# Patient Record
Sex: Female | Born: 1945
Health system: Southern US, Community
[De-identification: ages and names within clinical notes are randomized; demographics above are authoritative.]

## PROBLEM LIST (undated history)

## (undated) DIAGNOSIS — I2510 Atherosclerotic heart disease of native coronary artery without angina pectoris: Secondary | ICD-10-CM

## (undated) DIAGNOSIS — K219 Gastro-esophageal reflux disease without esophagitis: Secondary | ICD-10-CM

## (undated) DIAGNOSIS — I10 Essential (primary) hypertension: Secondary | ICD-10-CM

## (undated) DIAGNOSIS — I5181 Takotsubo syndrome: Principal | ICD-10-CM

## (undated) DIAGNOSIS — N8110 Cystocele, unspecified: Secondary | ICD-10-CM

## (undated) HISTORY — PX: TONSILLECTOMY: SUR1361

## (undated) HISTORY — PX: TUBAL LIGATION: SHX77

## (undated) HISTORY — DX: Atherosclerotic heart disease of native coronary artery without angina pectoris: I25.10

## (undated) HISTORY — DX: Essential (primary) hypertension: I10

---

## 1977-06-21 HISTORY — PX: BREAST CYST EXCISION: SHX579

## 2009-06-21 HISTORY — PX: BREAST SURGERY: SHX581

## 2012-03-15 DIAGNOSIS — Z23 Encounter for immunization: Secondary | ICD-10-CM | POA: Diagnosis not present

## 2012-05-04 DIAGNOSIS — K219 Gastro-esophageal reflux disease without esophagitis: Secondary | ICD-10-CM | POA: Diagnosis not present

## 2012-05-04 DIAGNOSIS — Z01818 Encounter for other preprocedural examination: Secondary | ICD-10-CM | POA: Diagnosis not present

## 2012-05-25 DIAGNOSIS — Q391 Atresia of esophagus with tracheo-esophageal fistula: Secondary | ICD-10-CM | POA: Diagnosis not present

## 2012-05-25 DIAGNOSIS — K219 Gastro-esophageal reflux disease without esophagitis: Secondary | ICD-10-CM | POA: Diagnosis not present

## 2012-05-25 DIAGNOSIS — K2940 Chronic atrophic gastritis without bleeding: Secondary | ICD-10-CM | POA: Diagnosis not present

## 2012-05-25 DIAGNOSIS — K2990 Gastroduodenitis, unspecified, without bleeding: Secondary | ICD-10-CM | POA: Diagnosis not present

## 2012-05-25 DIAGNOSIS — K2970 Gastritis, unspecified, without bleeding: Secondary | ICD-10-CM | POA: Diagnosis not present

## 2012-05-25 DIAGNOSIS — K257 Chronic gastric ulcer without hemorrhage or perforation: Secondary | ICD-10-CM | POA: Diagnosis not present

## 2012-05-25 DIAGNOSIS — K228 Other specified diseases of esophagus: Secondary | ICD-10-CM | POA: Diagnosis not present

## 2012-06-01 DIAGNOSIS — A048 Other specified bacterial intestinal infections: Secondary | ICD-10-CM | POA: Diagnosis not present

## 2012-06-26 DIAGNOSIS — Z124 Encounter for screening for malignant neoplasm of cervix: Secondary | ICD-10-CM | POA: Diagnosis not present

## 2012-06-26 DIAGNOSIS — Z1231 Encounter for screening mammogram for malignant neoplasm of breast: Secondary | ICD-10-CM | POA: Diagnosis not present

## 2012-06-26 DIAGNOSIS — N8111 Cystocele, midline: Secondary | ICD-10-CM | POA: Diagnosis not present

## 2012-06-26 DIAGNOSIS — R635 Abnormal weight gain: Secondary | ICD-10-CM | POA: Diagnosis not present

## 2012-06-26 DIAGNOSIS — M25519 Pain in unspecified shoulder: Secondary | ICD-10-CM | POA: Diagnosis not present

## 2012-06-26 DIAGNOSIS — N951 Menopausal and female climacteric states: Secondary | ICD-10-CM | POA: Diagnosis not present

## 2012-07-04 DIAGNOSIS — Z1211 Encounter for screening for malignant neoplasm of colon: Secondary | ICD-10-CM | POA: Diagnosis not present

## 2012-07-04 DIAGNOSIS — F411 Generalized anxiety disorder: Secondary | ICD-10-CM | POA: Diagnosis not present

## 2012-10-18 DIAGNOSIS — H1045 Other chronic allergic conjunctivitis: Secondary | ICD-10-CM | POA: Diagnosis not present

## 2012-10-18 DIAGNOSIS — H52229 Regular astigmatism, unspecified eye: Secondary | ICD-10-CM | POA: Diagnosis not present

## 2012-10-18 DIAGNOSIS — H5210 Myopia, unspecified eye: Secondary | ICD-10-CM | POA: Diagnosis not present

## 2012-10-18 DIAGNOSIS — H2510 Age-related nuclear cataract, unspecified eye: Secondary | ICD-10-CM | POA: Diagnosis not present

## 2012-12-09 DIAGNOSIS — R300 Dysuria: Secondary | ICD-10-CM | POA: Diagnosis not present

## 2012-12-09 DIAGNOSIS — N390 Urinary tract infection, site not specified: Secondary | ICD-10-CM | POA: Diagnosis not present

## 2013-01-24 DIAGNOSIS — N390 Urinary tract infection, site not specified: Secondary | ICD-10-CM | POA: Diagnosis not present

## 2013-04-18 DIAGNOSIS — Z719 Counseling, unspecified: Secondary | ICD-10-CM | POA: Diagnosis not present

## 2013-04-18 DIAGNOSIS — R9431 Abnormal electrocardiogram [ECG] [EKG]: Secondary | ICD-10-CM | POA: Diagnosis not present

## 2013-04-18 DIAGNOSIS — R51 Headache: Secondary | ICD-10-CM | POA: Diagnosis not present

## 2013-04-18 DIAGNOSIS — R0602 Shortness of breath: Secondary | ICD-10-CM | POA: Diagnosis not present

## 2013-04-18 DIAGNOSIS — R42 Dizziness and giddiness: Secondary | ICD-10-CM | POA: Diagnosis not present

## 2013-04-24 ENCOUNTER — Emergency Department (HOSPITAL_COMMUNITY)
Admission: EM | Admit: 2013-04-24 | Discharge: 2013-04-24 | Disposition: A | Discharge status: Home / Self Care | Payer: Medicare Other | Attending: Emergency Medicine | Admitting: Emergency Medicine

## 2013-04-24 ENCOUNTER — Emergency Department (HOSPITAL_COMMUNITY): Payer: Medicare Other

## 2013-04-24 ENCOUNTER — Encounter (HOSPITAL_COMMUNITY): Payer: Self-pay | Admitting: Emergency Medicine

## 2013-04-24 DIAGNOSIS — Z79899 Other long term (current) drug therapy: Secondary | ICD-10-CM | POA: Diagnosis not present

## 2013-04-24 DIAGNOSIS — R51 Headache: Secondary | ICD-10-CM | POA: Diagnosis not present

## 2013-04-24 DIAGNOSIS — I10 Essential (primary) hypertension: Secondary | ICD-10-CM | POA: Diagnosis not present

## 2013-04-24 DIAGNOSIS — R0609 Other forms of dyspnea: Secondary | ICD-10-CM | POA: Insufficient documentation

## 2013-04-24 DIAGNOSIS — K219 Gastro-esophageal reflux disease without esophagitis: Secondary | ICD-10-CM | POA: Insufficient documentation

## 2013-04-24 DIAGNOSIS — R42 Dizziness and giddiness: Secondary | ICD-10-CM | POA: Diagnosis not present

## 2013-04-24 DIAGNOSIS — R0989 Other specified symptoms and signs involving the circulatory and respiratory systems: Secondary | ICD-10-CM | POA: Insufficient documentation

## 2013-04-24 DIAGNOSIS — R110 Nausea: Secondary | ICD-10-CM | POA: Diagnosis not present

## 2013-04-24 DIAGNOSIS — R0600 Dyspnea, unspecified: Secondary | ICD-10-CM

## 2013-04-24 DIAGNOSIS — R079 Chest pain, unspecified: Secondary | ICD-10-CM | POA: Insufficient documentation

## 2013-04-24 HISTORY — DX: Gastro-esophageal reflux disease without esophagitis: K21.9

## 2013-04-24 LAB — CBC WITH DIFFERENTIAL/PLATELET
Eosinophils Absolute: 0.1 10*3/uL (ref 0.0–0.7)
Eosinophils Relative: 2 % (ref 0–5)
Lymphocytes Relative: 37 % (ref 12–46)
Lymphs Abs: 2.2 10*3/uL (ref 0.7–4.0)
MCH: 30.8 pg (ref 26.0–34.0)
MCV: 89.1 fL (ref 78.0–100.0)
Monocytes Relative: 9 % (ref 3–12)
Neutro Abs: 3.1 10*3/uL (ref 1.7–7.7)
Platelets: 273 10*3/uL (ref 150–400)
RBC: 4.42 MIL/uL (ref 3.87–5.11)

## 2013-04-24 LAB — COMPREHENSIVE METABOLIC PANEL
BUN: 18 mg/dL (ref 6–23)
Calcium: 10.1 mg/dL (ref 8.4–10.5)
Chloride: 106 mEq/L (ref 96–112)
Creatinine, Ser: 0.77 mg/dL (ref 0.50–1.10)
GFR calc Af Amer: >90 mL/min (ref >90)
Glucose, Bld: 98 mg/dL (ref 70–99)
Sodium: 142 mEq/L (ref 135–145)
Total Protein: 7.5 g/dL (ref 6.0–8.3)

## 2013-04-24 LAB — URINALYSIS, ROUTINE W REFLEX MICROSCOPIC
Hgb urine dipstick: NEGATIVE
Leukocytes, UA: NEGATIVE
Nitrite: NEGATIVE
Protein, ur: NEGATIVE mg/dL
Urobilinogen, UA: 0.2 mg/dL (ref 0.0–1.0)

## 2013-04-24 LAB — D-DIMER, QUANTITATIVE: D-Dimer, Quant: 0.33 ug/mL-FEU (ref 0.00–0.48)

## 2013-04-24 LAB — TROPONIN I
Troponin I: <0.3 ng/mL (ref <0.30)
Troponin I: <0.3 ng/mL (ref <0.30)

## 2013-04-24 MED ORDER — ASPIRIN 81 MG PO CHEW
CHEWABLE_TABLET | ORAL | Status: AC
  Administered 2013-04-24: 324 mg via ORAL
  Filled 2013-04-24: qty 4

## 2013-04-24 MED ORDER — ASPIRIN 81 MG PO CHEW
324.0000 mg | CHEWABLE_TABLET | Freq: Once | ORAL | Status: AC
  Administered 2013-04-24: 324 mg via ORAL

## 2013-04-24 NOTE — ED Notes (Signed)
Dr. Manus Gunning at bedside speaking with pt and family

## 2013-04-24 NOTE — ED Notes (Signed)
Ambulated around nurses station without difficulty. Denies being SOB

## 2013-04-24 NOTE — ED Notes (Signed)
Pt c/o intermittent sob, dizziness, left arm and left chest pain for the past few weeks, was seen at urgent care, sent to cardiologist who advised pt that everything was fine, was told that if she continued to have problems then they would do further workup, pt woke up this am with left arm and chest pain, states "i just felt worse and could not wait", Dr. Manus Gunning in prior to RN, see edp assessment for futher,

## 2013-04-24 NOTE — Consult Note (Signed)
Primary Physician: Primary Cardiologist:   HPI:  Pateint is a a67 yo with no prior cardiac problems  Asked to see re CP and SOB  The patient say she noticed over the past couple wks onset of CP  She describes 2 types  One is sharp, short live  Can come to different parts of chest  The other is a pressure sensation that occurs across chest  Both come and go  Not always associated with activity  Can come at rest. The patient also notes dyspnea on exertion that is new  Now when she walks across the house she gets SOB  NO SOB at rest  No PND  She [facility] [place].  Referred to a cardiology  Seen last wk.  Told nothing rwrong with heart.    Also complains of dizziness  No syncope and HA on top of head  Trying to drink more fluids        Past Medical History  Diagnosis Date  . GERD (gastroesophageal reflux disease)      (Not in a hospital admission)     Infusions:   Allergies  Allergen Reactions  . Ciprofloxacin     myalgia    History   Social History  . Marital Status: Widowed    Spouse Name: N/A    Number of Children: N/A  . Years of Education: N/A   Occupational History  . Not on file.   Social History Main Topics  . Smoking status: Never Smoker   . Smokeless tobacco: Not on file  . Alcohol Use: No  . Drug Use: Not on file  . Sexual Activity: Not on file   Other Topics Concern  . Not on file   Social History Narrative  . No narrative on file    No family history on file.  REVIEW OF SYSTEMS:  All systems reviewed  Negative to the above problem except as noted above.    PHYSICAL EXAM: Filed Vitals:   04/24/13 1018  BP: 164/70  Pulse: 61  Temp:   Resp: 16    No intake or output data in the 24 hours ending 04/24/13 1123  General:  Thin 67 yo in NAD HEENT: normal Neck: supple. no JVD. Carotids 2+ bilat; no bruits. No lymphadenopathy or thryomegaly appreciated. Cor: PMI nondisplaced. Regular rate & rhythm. No rubs,  gallops or murmurs. Lungs: clear Abdomen: soft, nontender, nondistended. No hepatosplenomegaly. No bruits or masses. Good bowel sounds. Extremities: no cyanosis, clubbing, rash, edema Neuro: alert & oriented x 3, cranial nerves grossly intact. moves all 4 extremities w/o difficulty. Affect pleasant.  ECG:  SR 68  Anteroseptal MI    Results for orders placed during the hospital encounter of 04/24/13 (from the past 24 hour(s))  CBC WITH DIFFERENTIAL     Status: None   Collection Time    04/24/13  9:10 AM      Result Value Range   WBC 5.9  4.0 - 10.5 K/uL   RBC 4.42  3.87 - 5.11 MIL/uL   Hemoglobin 13.6  12.0 - 15.0 g/dL   HCT 45.4  09.8 - 11.9 %   MCV 89.1  78.0 - 100.0 fL   MCH 30.8  26.0 - 34.0 pg   MCHC 34.5  30.0 - 36.0 g/dL   RDW 14.7  82.9 - 56.2 %   Platelets 273  150 - 400 K/uL   Neutrophils Relative % 52  43 - 77 %  Neutro Abs 3.1  1.7 - 7.7 K/uL   Lymphocytes Relative 37  12 - 46 %   Lymphs Abs 2.2  0.7 - 4.0 K/uL   Monocytes Relative 9  3 - 12 %   Monocytes Absolute 0.5  0.1 - 1.0 K/uL   Eosinophils Relative 2  0 - 5 %   Eosinophils Absolute 0.1  0.0 - 0.7 K/uL   Basophils Relative 0  0 - 1 %   Basophils Absolute 0.0  0.0 - 0.1 K/uL  COMPREHENSIVE METABOLIC PANEL     Status: Abnormal   Collection Time    04/24/13  9:10 AM      Result Value Range   Sodium 142  135 - 145 mEq/L   Potassium 3.6  3.5 - 5.1 mEq/L   Chloride 106  96 - 112 mEq/L   CO2 25  19 - 32 mEq/L   Glucose, Bld 98  70 - 99 mg/dL   BUN 18  6 - 23 mg/dL   Creatinine, Ser 1.61  0.50 - 1.10 mg/dL   Calcium 09.6  8.4 - 04.5 mg/dL   Total Protein 7.5  6.0 - 8.3 g/dL   Albumin 4.3  3.5 - 5.2 g/dL   AST 21  0 - 37 U/L   ALT 19  0 - 35 U/L   Alkaline Phosphatase 152 (*) 39 - 117 U/L   Total Bilirubin 0.4  0.3 - 1.2 mg/dL   GFR calc non Af Amer 86 (*) >90 mL/min   GFR calc Af Amer >90  >90 mL/min  TROPONIN I     Status: None   Collection Time    04/24/13  9:10 AM      Result Value Range    Troponin I <0.30  <0.30 ng/mL  D-DIMER, QUANTITATIVE     Status: None   Collection Time    04/24/13  9:10 AM      Result Value Range   D-Dimer, Quant 0.33  0.00 - 0.48 ug/mL-FEU   Dg Chest 2 View  04/24/2013   CLINICAL DATA:  Shortness of breath for 2 weeks  EXAM: CHEST  2 VIEW  COMPARISON:  None.  FINDINGS: Cardiomediastinal silhouette is unremarkable. No acute infiltrate or pleural effusion. No pulmonary edema. Minimal lower thoracic levoscoliosis. Mild degenerative changes mid thoracic spine.  IMPRESSION: No active cardiopulmonary disease. Minimal lower thoracic levoscoliosis.   Electronically Signed   By: Natasha Mead M.D.   On: 04/24/2013 09:53   Ct Head Wo Contrast  04/24/2013   CLINICAL DATA:  Headache and lightheadedness for 2 weeks, no known injury  EXAM: CT HEAD WITHOUT CONTRAST  TECHNIQUE: Contiguous axial images were obtained from the base of the skull through the vertex without intravenous contrast.  COMPARISON:  None.  FINDINGS: No mass lesion. No midline shift. No acute hemorrhage or hematoma. No extra-axial fluid collections. No evidence of acute infarction. Calvarium is intact. No significant inflammatory change in the sinuses.  IMPRESSION: Negative   Electronically Signed   By: Esperanza Heir M.D.   On: 04/24/2013 09:49     ASSESSMENT:  Patient is a 67 yo with no history of CAD Now with a few wk history of CP (pain and pressure ) and DOE.  Symptoms are not completely typical  Does have DOE which is new.  Exam unremarkable (though would get O2 sat while walking )  EKG with Q waves V1, V2 Small R V3  May be lead placement  Discussed with patinet  Needs evaluation  Discussed pro/con of stress test vs cath  She would prefer stress test.  Have sched Stress Cardiolite for Thursday 11/6  Needs to be in Radiology department at 7 AM NPO   Would recomm 81 mg ASA until test  Take activity at own pace.    HCM  Will need lipids  HTN  BP is high  Today  Will need to follow.

## 2013-04-24 NOTE — Progress Notes (Signed)
ED/CM noted patient did not have health insurance and/or PCP listed in the computer.  Patient was given the [place] resource handout with information on the clinics, food pantries, and the handout for new health insurance sign-up.  Patient expressed appreciation for this. 

## 2013-04-24 NOTE — ED Provider Notes (Signed)
CSN: [id number]     Arrival date & time 04/24/13  4696 History   This chart was scribed for Glynn Octave, MD, by Yevette Edwards, ED Scribe. This patient was seen in room [facility]18[facility] and the patient's care was started at 8:57 AM.  First MD Initiated Contact with Patient 04/24/13 [phone]     Chief Complaint  Patient presents with  . Shortness of Breath    HPI HPI Comments: Nancy Winters is a 67 y.o. female who presents to the Emergency Department complaining of two weeks of gradually-increasing, intermittent SOB which is increased with ambulation and is associated with the symptoms of intermittent chest pain, lightheadedness, nausea and headaches. She reports that at baseline she ambulates well. The SOB is not increased with a supine position. The pt visited [facility] [address] and was referred to a cardiologist, Dr. Adron Bene with [facility]. The pt has another appointment with a second cardiologist in six days, on April 30, 2013. She denies any known cardiac issues or previous performances of a stress test.  The pt has experienced intermittent chest pain which typically lasts for a few seconds or a few moments. The chest pain is intermittently underneath her left breast, underneath her right breast, or into her left shoulder.  Her last episode of chest pain was this morning. The headache is described as "nagging," is typically gradual-onset, and is located at the "top" of her head. She occasionally experiences a headache and lightheadedness even at rest.  She denies a h/o HTN. She denies taking any medications.  The pt is a non-smoker, though she lived with a smoker for 40 years.     The pt does not have a PCP. Past Medical History  Diagnosis Date  . GERD (gastroesophageal reflux disease)    Past Surgical History  Procedure Laterality Date  . Breast surgery    . Tonsillectomy    . Tubal ligation     No family history on file. History  Substance Use Topics  . Smoking status: Never  Smoker   . Smokeless tobacco: Not on file  . Alcohol Use: No   No OB history provided.  Review of Systems  Respiratory: Positive for shortness of breath.   Cardiovascular: Positive for chest pain.  Gastrointestinal: Positive for nausea.  Neurological: Positive for light-headedness and headaches.  All other systems reviewed and are negative.    Allergies  Ciprofloxacin  Home Medications   Current Outpatient Rx  Name  Route  Sig  Dispense  Refill  . CRANBERRY PO   Oral   Take 2 capsules by mouth daily.         . Glucosamine Sulfate-MSM (GLUCOSAMINE-MSM DS PO)   Oral   Take 1 tablet by mouth 3 (three) times daily.         . Multiple Vitamins-Minerals (MULTIVITAMINS THER. W/MINERALS) TABS tablet   Oral   Take 1 tablet by mouth daily.         Marland Kitchen omeprazole (PRILOSEC) 20 MG capsule   Oral   Take 20 mg by mouth daily.         . ranitidine (ZANTAC) 300 MG tablet   Oral   Take 300 mg by mouth at bedtime.           Triage Vitals: BP 176/76  Pulse 71  Temp(Src) 98 F (36.7 C) (Oral)  Resp 23  Ht 5\' 6"  (1.676 m)  Wt 153 lb (69.4 kg)  BMI 24.71 kg/m2  SpO2 100%  Physical Exam  Nursing note and vitals reviewed. Constitutional: She is oriented to person, place, and time. She appears well-developed and well-nourished. No distress.  HENT:  Head: Normocephalic and atraumatic.  Eyes: EOM are normal.  Neck: Neck supple. No tracheal deviation present.  Cardiovascular: Normal rate, regular rhythm and normal heart sounds.   No murmur heard. Pulmonary/Chest: Effort normal and breath sounds normal. No respiratory distress. She has no wheezes. She has no rales.  Abdominal: Soft. There is no tenderness.  Musculoskeletal: Normal range of motion.  Neurological: She is alert and oriented to person, place, and time. She has normal reflexes. She displays normal reflexes. No cranial nerve deficit. She exhibits normal muscle tone. Coordination normal.  CN 2-12 intact, no  ataxia on finger to nose, no nystagmus, 5/5 strength throughout, no pronator drift, Romberg negative, normal gait.   Skin: Skin is warm and dry.  Psychiatric: She has a normal mood and affect. Her behavior is normal.    ED Course  Procedures (including critical care time)  DIAGNOSTIC STUDIES: Oxygen Saturation is 100% on room air, normal by my interpretation.    COORDINATION OF CARE:  9:05 AM- Discussed treatment plan with patient, and the patient agreed to the plan.   10:12 AM- Rechecked pt.   12:45 PM- Rechecked pt. Pt has an appointment with the cardiologist in two days.   Labs Review Labs Reviewed  COMPREHENSIVE METABOLIC PANEL - Abnormal; Notable for the following:    Alkaline Phosphatase 152 (*)    GFR calc non Af Amer 86 (*)    All other components within normal limits  URINALYSIS, ROUTINE W REFLEX MICROSCOPIC - Abnormal; Notable for the following:    Specific Gravity, Urine <1.005 (*)    All other components within normal limits  CBC WITH DIFFERENTIAL  TROPONIN I  D-DIMER, QUANTITATIVE  TROPONIN I   Imaging Review Dg Chest 2 View  04/24/2013   CLINICAL DATA:  Shortness of breath for 2 weeks  EXAM: CHEST  2 VIEW  COMPARISON:  None.  FINDINGS: Cardiomediastinal silhouette is unremarkable. No acute infiltrate or pleural effusion. No pulmonary edema. Minimal lower thoracic levoscoliosis. Mild degenerative changes mid thoracic spine.  IMPRESSION: No active cardiopulmonary disease. Minimal lower thoracic levoscoliosis.   Electronically Signed   By: Natasha Mead M.D.   On: 04/24/2013 09:53   Ct Head Wo Contrast  04/24/2013   CLINICAL DATA:  Headache and lightheadedness for 2 weeks, no known injury  EXAM: CT HEAD WITHOUT CONTRAST  TECHNIQUE: Contiguous axial images were obtained from the base of the skull through the vertex without intravenous contrast.  COMPARISON:  None.  FINDINGS: No mass lesion. No midline shift. No acute hemorrhage or hematoma. No extra-axial fluid  collections. No evidence of acute infarction. Calvarium is intact. No significant inflammatory change in the sinuses.  IMPRESSION: Negative   Electronically Signed   By: Esperanza Heir M.D.   On: 04/24/2013 09:49    EKG Interpretation     Ventricular Rate:  68 PR Interval:  178 QRS Duration: 76 QT Interval:  412 QTC Calculation: 438 R Axis:   45 Text Interpretation:  Normal sinus rhythm Anteroseptal infarct , age undetermined Abnormal ECG No previous ECGs available septal Q waves  No previous ECGs available            MDM   1. Chest pain   2. Dyspnea   3. Hypertension    2 week history of exertional SOB, chest pain at times with lightheadedness and headache. No CP  or SOB now. Concerning for anginal equivalent.   EKG with septal Q waves.  Troponin and D-dimer negative. Hypertensive here, no history of same.  D/w Dr. Tenny Craw who has seen patient.  Offered stress test versus cath.  Patient prefers stress test which Dr. Tenny Craw has arrange for 11/6. Delta troponin negative. No chest pain or SOB in ED.  Able to ambulate without dyspnea.  I personally performed the services described in this documentation, which was scribed in my presence. The recorded information has been reviewed and is accurate.      Glynn Octave, MD 04/24/13 [phone]

## 2013-04-24 NOTE — ED Notes (Signed)
Dr. Tenny Craw cardiologist with [facility] cardiologist at bedside speaking with pt and family

## 2013-04-24 NOTE — ED Notes (Signed)
Pt states symptoms began 2 weeks ago with headache, lightheadedness, and sob. Went to see PMD and stated EKG changes and past MI. Went to see cardiologist that same day and stated everything was fine. Another appt with cardiologist on the 10th. Intermittent pain to left arm and breast area since Saturday. Denies pain at this time. Pt states she is feeling worse and can not wait until appt to see cardiologist.

## 2013-04-25 ENCOUNTER — Other Ambulatory Visit: Payer: Self-pay | Admitting: *Deleted

## 2013-04-25 DIAGNOSIS — R079 Chest pain, unspecified: Secondary | ICD-10-CM

## 2013-04-26 ENCOUNTER — Encounter (HOSPITAL_COMMUNITY)
Admission: RE | Admit: 2013-04-26 | Discharge: 2013-04-26 | Disposition: A | Discharge status: Home / Self Care | Payer: Medicare Other | Source: Ambulatory Visit | Attending: Internal Medicine | Admitting: Internal Medicine

## 2013-04-26 ENCOUNTER — Encounter (HOSPITAL_COMMUNITY): Payer: Self-pay

## 2013-04-26 DIAGNOSIS — R0609 Other forms of dyspnea: Secondary | ICD-10-CM | POA: Diagnosis not present

## 2013-04-26 DIAGNOSIS — R079 Chest pain, unspecified: Secondary | ICD-10-CM

## 2013-04-26 DIAGNOSIS — R0989 Other specified symptoms and signs involving the circulatory and respiratory systems: Secondary | ICD-10-CM | POA: Insufficient documentation

## 2013-04-26 MED ORDER — REGADENOSON 0.4 MG/5ML IV SOLN
INTRAVENOUS | Status: AC
  Filled 2013-04-26: qty 5

## 2013-04-26 MED ORDER — TECHNETIUM TC 99M SESTAMIBI - CARDIOLITE
10.0000 | Freq: Once | INTRAVENOUS | Status: AC | PRN
  Administered 2013-04-26: 10 via INTRAVENOUS

## 2013-04-26 MED ORDER — SODIUM CHLORIDE 0.9 % IJ SOLN
INTRAMUSCULAR | Status: AC
  Administered 2013-04-26: 10 mL via INTRAVENOUS
  Filled 2013-04-26: qty 10

## 2013-04-26 MED ORDER — TECHNETIUM TC 99M SESTAMIBI - CARDIOLITE
30.0000 | Freq: Once | INTRAVENOUS | Status: AC | PRN
  Administered 2013-04-26: 30 via INTRAVENOUS

## 2013-04-26 NOTE — Progress Notes (Signed)
Stress Lab Nurses Notes - [facility]  ALIZZA SACRA 04/26/2013 Reason for doing test: Chest Pain Type of test: Stress Myoview Nurse performing test: Parke Poisson, RN Nuclear Medicine Tech: Lyndel Pleasure Echo Tech: Not Applicable MD performing test: Inis Sizer NP Family MD: NPCP Test explained and consent signed: yes IV started: 22g jelco, Saline lock flushed, No redness or edema and Saline lock started in radiology Symptoms: SOB Treatment/Intervention: None Reason test stopped: reached target HR After recovery IV was: Discontinued via X-ray tech and No redness or edema Patient to return to Nuc. Med at : 9:45 Patient discharged: Home Patient's Condition upon discharge was: stable Comments: During test peak BP 202/119 & HR 148.  Recovery BP 161/81 & HR 83.  Symptoms resolved in recovery. Erskine Speed T

## 2013-05-03 ENCOUNTER — Telehealth: Payer: Self-pay | Admitting: Cardiology

## 2013-05-03 NOTE — Telephone Encounter (Signed)
.  left message to have patient return my call to confirm further sxs

## 2013-05-03 NOTE — Telephone Encounter (Signed)
Patient is calling office for SOB.  Patient was seen in ED by Dr.Ross. Ordered myoview.  Patient has not been seen in our office yet.  Has appointment to establish with Dr.McDowell on 11/24.  Patient states that she is still having SOB and doesn't know what to do.  I advised patient that I would have nurse check in to this and return her call.  tgs

## 2013-05-04 NOTE — Telephone Encounter (Signed)
Please advise if any further instructions are needed  

## 2013-05-04 NOTE — Telephone Encounter (Signed)
I have not seen this patient yet. Dr. Tenny Craw arranged an outpatient Cardiolite which was normal, please see her comment on the final report. If she continues to have symptoms, I would recommend that she keep her visit so we can discuss things further. Certainly if her symptoms escalated in the interim, would seek more urgent medical care in the ER.

## 2013-05-04 NOTE — Telephone Encounter (Signed)
Pt returned call to advise the SOB is better today and was worse on Wednesday, noted normally happens when going up and down steps however now it can happen walking from room to room any and all times, pt noted SOB a few weeks ago when she walked up the steps and could not hardly talk because she could not catch her breath, pt noted a lot of burping/belching a lot as well and made an apt with her GI next Friday per earliest available, pt notes sharp pain in her chest from time to time that only lasts for a few seconds and she feels maybe due to her indigestion that all started 7-10 days on and off not everyday, pt denies dizzyness however more lightheadness when she stands up, Pt made aware that the provider is currently providing care for other patients in office/hospital setting/out of office/or away from their desk. As soon as the provider responds to your concern/question/results, a member of our staff will contact you as soon as time permits. If the patient has not heard a response by the end of the business day (5pm) they will be contacted the following business day. Pt understood.

## 2013-05-11 DIAGNOSIS — R109 Unspecified abdominal pain: Secondary | ICD-10-CM | POA: Diagnosis not present

## 2013-05-11 DIAGNOSIS — K219 Gastro-esophageal reflux disease without esophagitis: Secondary | ICD-10-CM | POA: Diagnosis not present

## 2013-05-14 ENCOUNTER — Other Ambulatory Visit: Payer: Self-pay | Admitting: Cardiology

## 2013-05-14 ENCOUNTER — Ambulatory Visit (INDEPENDENT_AMBULATORY_CARE_PROVIDER_SITE_OTHER): Payer: Medicare Other | Admitting: Cardiology

## 2013-05-14 ENCOUNTER — Encounter: Payer: Self-pay | Admitting: Cardiology

## 2013-05-14 VITALS — BP 144/84 | HR 70 | Ht 65.0 in | Wt 152.0 lb

## 2013-05-14 DIAGNOSIS — R072 Precordial pain: Secondary | ICD-10-CM | POA: Diagnosis not present

## 2013-05-14 DIAGNOSIS — G459 Transient cerebral ischemic attack, unspecified: Secondary | ICD-10-CM

## 2013-05-14 DIAGNOSIS — K219 Gastro-esophageal reflux disease without esophagitis: Secondary | ICD-10-CM | POA: Diagnosis not present

## 2013-05-14 DIAGNOSIS — R0602 Shortness of breath: Secondary | ICD-10-CM

## 2013-05-14 DIAGNOSIS — Z8673 Personal history of transient ischemic attack (TIA), and cerebral infarction without residual deficits: Secondary | ICD-10-CM | POA: Insufficient documentation

## 2013-05-14 NOTE — Patient Instructions (Addendum)
Your physician recommends that you schedule a follow-up appointment  If needed,we will call you with results   Your physician has requested that you have an echocardiogram. Echocardiography is a painless test that uses sound waves to create images of your heart. It provides your doctor with information about the size and shape of your heart and how well your heart's chambers and valves are working. This procedure takes approximately one hour. There are no restrictions for this procedure.  Your physician has requested that you have a carotid duplex. This test is an ultrasound of the carotid arteries in your neck. It looks at blood flow through these arteries that supply the brain with blood. Allow one hour for this exam. There are no restrictions or special instructions.

## 2013-05-14 NOTE — Assessment & Plan Note (Signed)
Continues on antacids.

## 2013-05-14 NOTE — Progress Notes (Signed)
    Clinical Summary Nancy Winters is a 67 y.o.female presenting for office visit. This is our first meeting. Records indicate cardiology evaluation by a provider in [place] (details not clear), more recently ER consultation with Dr. Dietrich Pates on 11/4 due to chest pain symptoms. Troponin I and d-dimer levels were normal, chest x-ray showed no acute cardiopulmonary findings.. ECG showed sinus rhythm with poor R-wave progression, cannot rule out old anterior infarct pattern. She was referred for followup testing.  Exercise Cardiolite in 11/5 was negative for ischemia by both ECG and perfusion criteria, LVEF of 75%. I discussed this with her today. She states her symptoms have largely resolved, she still feels somewhat short of breath at times. No further chest pain. She also tells me about an episode where she felt that her speech was "off" in the setting of headache several weeks ago. She has no history of carotid artery disease. She has been on aspirin. She is also in the process of establishing primary care followup with Dr. Margo Aye.   Allergies  Allergen Reactions  . Ciprofloxacin     myalgia    Current Outpatient Prescriptions  Medication Sig Dispense Refill  . CRANBERRY PO Take 2 capsules by mouth daily.      Marland Kitchen esomeprazole (NEXIUM) 40 MG capsule Take 40 mg by mouth daily at 12 noon.      . Glucosamine Sulfate-MSM (GLUCOSAMINE-MSM DS PO) Take 1 tablet by mouth 3 (three) times daily.      . Multiple Vitamins-Minerals (MULTIVITAMINS THER. W/MINERALS) TABS tablet Take 1 tablet by mouth daily.      . ranitidine (ZANTAC) 300 MG tablet Take 300 mg by mouth at bedtime.       No current facility-administered medications for this visit.    Past Medical History  Diagnosis Date  . GERD (gastroesophageal reflux disease)     Social History Nancy Winters reports that she has never smoked. She does not have any smokeless tobacco history on file. Nancy Winters reports that she does not drink  alcohol.  Review of Systems Negative except as outlined.  Physical Examination Filed Vitals:   05/14/13 1324  BP: 144/84  Pulse: 70   Filed Weights   05/14/13 1324  Weight: 152 lb (68.947 kg)   Patient appears comfortable at rest. HEENT: Conjunctiva and lids normal, oropharynx clear. Neck: Supple, no elevated JVP or carotid bruits, no thyromegaly. Lungs: Clear to auscultation, nonlabored breathing at rest. Cardiac: Regular rate and rhythm, no S3 or significant systolic murmur, no pericardial rub. Abdomen: Soft, nontender, bowel sounds present, no guarding or rebound. Extremities: No pitting edema, distal pulses 2+. Skin: Warm and dry. Musculoskeletal: No kyphosis. Neuropsychiatric: Alert and oriented x3, affect grossly appropriate.   Problem List and Plan   Precordial pain Resolved, although still feels intermittent shortness of breath. Recent Cardiolite indicates no ischemia. We will go ahead and obtain an echocardiogram to better assess cardiac structure and function , if reassuring doubt that we need to pursue further cardiac testing unless her symptoms progress.  History of TIA (transient ischemic attack) Questionable, reports brief period, when her speech was "off" several weeks ago. She does not have any carotid bruits. We will obtain carotid Dopplers to exclude significant stenosis. Otherwise continue on aspirin.  GERD (gastroesophageal reflux disease) Continues on antacids.    Jonelle Sidle, M.D., F.A.C.C.

## 2013-05-14 NOTE — Assessment & Plan Note (Signed)
Resolved, although still feels intermittent shortness of breath. Recent Cardiolite indicates no ischemia. We will go ahead and obtain an echocardiogram to better assess cardiac structure and function , if reassuring doubt that we need to pursue further cardiac testing unless her symptoms progress.

## 2013-05-14 NOTE — Assessment & Plan Note (Signed)
Questionable, reports brief period, when her speech was "off" several weeks ago. She does not have any carotid bruits. We will obtain carotid Dopplers to exclude significant stenosis. Otherwise continue on aspirin.

## 2013-05-15 ENCOUNTER — Encounter: Payer: Self-pay | Admitting: Cardiology

## 2013-05-20 DIAGNOSIS — J111 Influenza due to unidentified influenza virus with other respiratory manifestations: Secondary | ICD-10-CM | POA: Diagnosis not present

## 2013-05-25 ENCOUNTER — Ambulatory Visit (HOSPITAL_COMMUNITY): Payer: Medicare Other

## 2013-05-28 ENCOUNTER — Ambulatory Visit (HOSPITAL_COMMUNITY): Payer: Medicare Other

## 2013-06-01 ENCOUNTER — Ambulatory Visit (HOSPITAL_COMMUNITY)
Admission: RE | Admit: 2013-06-01 | Discharge: 2013-06-01 | Disposition: A | Discharge status: Home / Self Care | Payer: Medicare Other | Source: Ambulatory Visit | Attending: Cardiology | Admitting: Cardiology

## 2013-06-01 DIAGNOSIS — R0602 Shortness of breath: Secondary | ICD-10-CM

## 2013-06-01 DIAGNOSIS — R079 Chest pain, unspecified: Secondary | ICD-10-CM | POA: Diagnosis not present

## 2013-06-01 DIAGNOSIS — I519 Heart disease, unspecified: Secondary | ICD-10-CM

## 2013-06-01 DIAGNOSIS — G459 Transient cerebral ischemic attack, unspecified: Secondary | ICD-10-CM | POA: Insufficient documentation

## 2013-06-01 DIAGNOSIS — R0989 Other specified symptoms and signs involving the circulatory and respiratory systems: Secondary | ICD-10-CM | POA: Insufficient documentation

## 2013-06-01 DIAGNOSIS — R0609 Other forms of dyspnea: Secondary | ICD-10-CM | POA: Insufficient documentation

## 2013-06-01 NOTE — Progress Notes (Signed)
*  PRELIMINARY RESULTS* Echocardiogram 2D Echocardiogram has been performed.  Nancy Winters 06/01/2013, 3:04 PM

## 2013-06-08 ENCOUNTER — Other Ambulatory Visit (HOSPITAL_COMMUNITY): Payer: Medicare Other

## 2013-07-02 DIAGNOSIS — Z1212 Encounter for screening for malignant neoplasm of rectum: Secondary | ICD-10-CM | POA: Diagnosis not present

## 2013-07-02 DIAGNOSIS — N390 Urinary tract infection, site not specified: Secondary | ICD-10-CM | POA: Diagnosis not present

## 2013-07-02 DIAGNOSIS — N8111 Cystocele, midline: Secondary | ICD-10-CM | POA: Diagnosis not present

## 2013-07-02 DIAGNOSIS — Z1231 Encounter for screening mammogram for malignant neoplasm of breast: Secondary | ICD-10-CM | POA: Diagnosis not present

## 2013-07-02 DIAGNOSIS — R030 Elevated blood-pressure reading, without diagnosis of hypertension: Secondary | ICD-10-CM | POA: Diagnosis not present

## 2013-07-02 DIAGNOSIS — Z01419 Encounter for gynecological examination (general) (routine) without abnormal findings: Secondary | ICD-10-CM | POA: Diagnosis not present

## 2013-07-02 DIAGNOSIS — N951 Menopausal and female climacteric states: Secondary | ICD-10-CM | POA: Diagnosis not present

## 2013-07-18 DIAGNOSIS — Z79899 Other long term (current) drug therapy: Secondary | ICD-10-CM | POA: Diagnosis not present

## 2013-07-18 DIAGNOSIS — K219 Gastro-esophageal reflux disease without esophagitis: Secondary | ICD-10-CM | POA: Diagnosis not present

## 2013-07-18 DIAGNOSIS — R0602 Shortness of breath: Secondary | ICD-10-CM | POA: Diagnosis not present

## 2013-07-18 DIAGNOSIS — Z0000 Encounter for general adult medical examination without abnormal findings: Secondary | ICD-10-CM | POA: Diagnosis not present

## 2013-07-18 DIAGNOSIS — R030 Elevated blood-pressure reading, without diagnosis of hypertension: Secondary | ICD-10-CM | POA: Diagnosis not present

## 2013-09-18 DIAGNOSIS — I10 Essential (primary) hypertension: Secondary | ICD-10-CM | POA: Diagnosis not present

## 2013-09-18 DIAGNOSIS — R51 Headache: Secondary | ICD-10-CM | POA: Diagnosis not present

## 2013-10-16 DIAGNOSIS — I10 Essential (primary) hypertension: Secondary | ICD-10-CM | POA: Diagnosis not present

## 2013-10-18 DIAGNOSIS — H43819 Vitreous degeneration, unspecified eye: Secondary | ICD-10-CM | POA: Diagnosis not present

## 2013-10-18 DIAGNOSIS — H2510 Age-related nuclear cataract, unspecified eye: Secondary | ICD-10-CM | POA: Diagnosis not present

## 2013-10-18 DIAGNOSIS — H1045 Other chronic allergic conjunctivitis: Secondary | ICD-10-CM | POA: Diagnosis not present

## 2013-11-09 DIAGNOSIS — G589 Mononeuropathy, unspecified: Secondary | ICD-10-CM | POA: Diagnosis not present

## 2013-11-09 DIAGNOSIS — G63 Polyneuropathy in diseases classified elsewhere: Secondary | ICD-10-CM | POA: Diagnosis not present

## 2013-11-09 DIAGNOSIS — M21619 Bunion of unspecified foot: Secondary | ICD-10-CM | POA: Diagnosis not present

## 2013-11-09 DIAGNOSIS — Z79899 Other long term (current) drug therapy: Secondary | ICD-10-CM | POA: Diagnosis not present

## 2013-11-09 DIAGNOSIS — I10 Essential (primary) hypertension: Secondary | ICD-10-CM | POA: Diagnosis not present

## 2013-12-06 DIAGNOSIS — N390 Urinary tract infection, site not specified: Secondary | ICD-10-CM | POA: Diagnosis not present

## 2013-12-10 DIAGNOSIS — D235 Other benign neoplasm of skin of trunk: Secondary | ICD-10-CM | POA: Diagnosis not present

## 2014-01-31 DIAGNOSIS — R300 Dysuria: Secondary | ICD-10-CM | POA: Diagnosis not present

## 2014-01-31 DIAGNOSIS — R319 Hematuria, unspecified: Secondary | ICD-10-CM | POA: Diagnosis not present

## 2014-02-11 ENCOUNTER — Inpatient Hospital Stay (HOSPITAL_COMMUNITY)
Admission: AD | Admit: 2014-02-11 | Discharge: 2014-02-14 | DRG: 281 | Disposition: A | Discharge status: Home / Self Care | Payer: Medicare Other | Source: Other Acute Inpatient Hospital | Attending: Cardiology | Admitting: Cardiology

## 2014-02-11 ENCOUNTER — Encounter (HOSPITAL_COMMUNITY): Payer: Self-pay | Admitting: *Deleted

## 2014-02-11 DIAGNOSIS — I219 Acute myocardial infarction, unspecified: Secondary | ICD-10-CM | POA: Diagnosis not present

## 2014-02-11 DIAGNOSIS — E876 Hypokalemia: Secondary | ICD-10-CM | POA: Diagnosis present

## 2014-02-11 DIAGNOSIS — I214 Non-ST elevation (NSTEMI) myocardial infarction: Secondary | ICD-10-CM | POA: Diagnosis present

## 2014-02-11 DIAGNOSIS — I10 Essential (primary) hypertension: Secondary | ICD-10-CM | POA: Diagnosis present

## 2014-02-11 DIAGNOSIS — Z8673 Personal history of transient ischemic attack (TIA), and cerebral infarction without residual deficits: Secondary | ICD-10-CM

## 2014-02-11 DIAGNOSIS — K219 Gastro-esophageal reflux disease without esophagitis: Secondary | ICD-10-CM | POA: Diagnosis present

## 2014-02-11 DIAGNOSIS — I472 Ventricular tachycardia, unspecified: Secondary | ICD-10-CM | POA: Diagnosis present

## 2014-02-11 DIAGNOSIS — I4729 Other ventricular tachycardia: Secondary | ICD-10-CM | POA: Diagnosis present

## 2014-02-11 DIAGNOSIS — I5181 Takotsubo syndrome: Secondary | ICD-10-CM | POA: Diagnosis not present

## 2014-02-11 DIAGNOSIS — I252 Old myocardial infarction: Secondary | ICD-10-CM | POA: Diagnosis not present

## 2014-02-11 DIAGNOSIS — I2510 Atherosclerotic heart disease of native coronary artery without angina pectoris: Secondary | ICD-10-CM

## 2014-02-11 DIAGNOSIS — N814 Uterovaginal prolapse, unspecified: Secondary | ICD-10-CM | POA: Diagnosis present

## 2014-02-11 DIAGNOSIS — I248 Other forms of acute ischemic heart disease: Secondary | ICD-10-CM | POA: Diagnosis present

## 2014-02-11 DIAGNOSIS — I428 Other cardiomyopathies: Secondary | ICD-10-CM | POA: Diagnosis present

## 2014-02-11 DIAGNOSIS — R079 Chest pain, unspecified: Secondary | ICD-10-CM | POA: Diagnosis not present

## 2014-02-11 DIAGNOSIS — Z87440 Personal history of urinary (tract) infections: Secondary | ICD-10-CM

## 2014-02-11 DIAGNOSIS — I201 Angina pectoris with documented spasm: Secondary | ICD-10-CM | POA: Diagnosis not present

## 2014-02-11 DIAGNOSIS — I2489 Other forms of acute ischemic heart disease: Secondary | ICD-10-CM | POA: Diagnosis present

## 2014-02-11 HISTORY — DX: Cystocele, unspecified: N81.10

## 2014-02-11 HISTORY — DX: Takotsubo syndrome: I51.81

## 2014-02-11 NOTE — H&P (Addendum)
Patient ID: Nancy Winters MRN: [id number], DOB/AGE: 67-Feb-1947   Admit date: 02/11/2014   Primary Physician: No PCP Per Patient Primary Cardiologist: Rozann Lesches, MD  Pt. Profile:  68F Jehovah's Witness with a history of HTN, GERD, recurrent UTIs in setting of uterine prolapse who p/w NSTEMI.   Problem List  Past Medical History  Diagnosis Date  . GERD (gastroesophageal reflux disease)   . Hypertension   . Shortness of breath     for about a year    Past Surgical History  Procedure Laterality Date  . Tonsillectomy    . Tubal ligation    . Breast surgery Left 2011    mild ducts removed  . Breast cyst excision Left 1979     Allergies  Allergies  Allergen Reactions  . Ciprofloxacin     myalgia    HPI  68F Jehovah's Witness with a history of HTN, GERD, recurrent UTIs in setting of uterine prolapse who p/w NSTEMI.  She reports that at 1pm today she developed SSCP and began "not feeling good." Pain was consistently present but fluctuated in severity with max of 8/10. At 5pm while standing and talking, she developed acute dizziness, nausea, and syncopized briefly. EMS was called. She was given SL NTG and felt better around the time she got the med but cannot remember if she felt better before or after getting the NTG. She was seen in a ER in [place] where evaluation was notable for Cr 0.6, K 3.7. BNP < 10. TnI 3.78  (ULN 0.05), CKMB 9.5 (ULN 3.6). ECG (my read) demonstrated NSR, old ASMI, low voltages. Unchanged from prior on 04/25/13. She was started on IV heparin and accepted by Dr. Harl Bowie for transfer to [facility]. On arrival, she had mild CP that improved with SL NTG but recurred with minimal provocation. An ECG obtained shortly after getting nitro and becoming chest pain free (1:21AM) demonstrated new mild concave up STE with prominent J point in V3-V6 without reciprocal changes. Follow-up at 1:47am demonstrated borderline progression in the setting of no chest pain. Case  was discussed with Dr. Gwenlyn Found. Decision was to initiate IV NTG. After titration to 40, there was slight improvement in ST segments and the patient remained chest pain free.   Of note, she reports she has had dyspnea and a cough for a few months without a diagnosis. She has had chest pain in the past and undergone a work-up including an exercise cardiolyte in Nov 2014 that was normal by ECG and perfusion with EF 75%.   Home Medications  Prior to Admission medications   Medication Sig Start Date End Date Taking? Authorizing Provider  CRANBERRY PO Take 2 capsules by mouth daily.    Historical Provider, MD  esomeprazole (NEXIUM) 40 MG capsule Take 40 mg by mouth daily at 12 noon.    Historical Provider, MD  Glucosamine Sulfate-MSM (GLUCOSAMINE-MSM DS PO) Take 1 tablet by mouth 3 (three) times daily.    Historical Provider, MD  Multiple Vitamins-Minerals (MULTIVITAMINS THER. W/MINERALS) TABS tablet Take 1 tablet by mouth daily.    Historical Provider, MD  ranitidine (ZANTAC) 300 MG tablet Take 300 mg by mouth at bedtime.    Historical Provider, MD    Family History  History reviewed. No pertinent family history.  Social History  History   Social History  . Marital Status: Widowed    Spouse Name: N/A    Number of Children: N/A  . Years of Education: N/A   Occupational  History  . Not on file.   Social History Main Topics  . Smoking status: Never Smoker   . Smokeless tobacco: Not on file  . Alcohol Use: No  . Drug Use: No  . Sexual Activity: No   Other Topics Concern  . Not on file   Social History Narrative  . No narrative on file     Review of Systems General:  No chills, fever, night sweats or weight changes.  Cardiovascular:  See HPI Dermatological: No rash, lesions/masses Respiratory: + cough, dyspnea Urologic: No hematuria, dysuria. Does have frequent UTIs.  Abdominal:   No nausea, vomiting, diarrhea, bright red blood per rectum, melena, or hematemesis Neurologic:   No visual changes, wkns, changes in mental status. All other systems reviewed and are otherwise negative except as noted above.  Physical Exam  There were no vitals taken for this visit.  General: Pleasant, NAD Psych: Normal affect. Neuro: Alert and oriented X 3. Moves all extremities spontaneously. HEENT: Normal  Neck: Supple without bruits or JVD. Lungs:  Resp regular and unlabored, CTA. Heart: RRR no s3, s4, or murmurs. Abdomen: Soft, non-tender, non-distended, BS + x 4.  Extremities: No clubbing, cyanosis or edema. DP/PT/Radials 2+ and equal bilaterally.  Labs  Troponin (Point of Care Test) No results found for this basename: TROPIPOC,  in the last 72 hours No results found for this basename: CKTOTAL, CKMB, TROPONINI,  in the last 72 hours Lab Results  Component Value Date   WBC 5.9 04/24/2013   HGB 13.6 04/24/2013   HCT 39.4 04/24/2013   MCV 89.1 04/24/2013   PLT 273 04/24/2013   No results found for this basename: NA, K, CL, CO2, BUN, CREATININE, CALCIUM, LABALBU, PROT, BILITOT, ALKPHOS, ALT, AST, GLUCOSE,  in the last 168 hours No results found for this basename: CHOL, HDL, LDLCALC, TRIG   Lab Results  Component Value Date   DDIMER 0.33 04/24/2013     Radiology/Studies  No results found.  TTE 06/01/13 Study Conclusions  - Left ventricle: The cavity size was normal. Wall thickness was normal. Systolic function was normal. The estimated ejection fraction was in the range of 60% to 65%. Wall motion was normal; there were no regional wall motion abnormalities. Doppler parameters are consistent with abnormal left ventricular relaxation (grade 1 diastolic dysfunction). - Aortic valve: Mildly calcified annulus. Trileaflet. No significant regurgitation. Mean gradient: 47mm Hg (S). - Mitral valve: Trivial regurgitation. - Right atrium: Central venous pressure: 69mm Hg (est). - Atrial septum: No defect or patent foramen ovale was identified. - Tricuspid valve: Trivial  regurgitation. - Pulmonary arteries: PA peak pressure: 30mm Hg (S). - Pericardium, extracardiac: A trivial pericardial effusion was identified. Impressions:  - No prior study for comparison. Normal LV wall thickness with LVEF 40-98%, grade 1 diastolic dysfunction. No major valvular abnormalties. PASP normal at 21 mmHg. No ASD or PFO. Trivial pericardial effusion.  ECG See HPI    ASSESSMENT AND PLAN  62F Jehovah's Witness with a history of HTN, GERD, recurrent UTIs in setting of uterine prolapse who p/w NSTEMI.   NSTEMI. The STE in V3-V6 is not classic concave down that one would expect in STEMI. She also has a prominent J point and no reciprocal changes. Although it does not appear that she needs to go to the cath lab over night as she is chest pain free and the STE is atypical, it would be prudent for her to be first case.  1. NPO for cath, 1st case. Consider radial  access given patient is Jehovah's Witness 2. Continue IV UFH and NTG 3. Metoprolol 12.5mg  BID 4. ASA 81mg  5. Atorva 80mg  6. Check A1c and lipid panel  HTN 1. Continue losartan 25mg   Signed, Lamar Sprinkles, MD 02/11/2014, 11:59 PM

## 2014-02-12 ENCOUNTER — Encounter (HOSPITAL_COMMUNITY)
Admission: AD | Disposition: A | Discharge status: Home / Self Care | Payer: Self-pay | Source: Other Acute Inpatient Hospital | Attending: Cardiology

## 2014-02-12 ENCOUNTER — Encounter (HOSPITAL_COMMUNITY): Payer: Self-pay | Admitting: *Deleted

## 2014-02-12 DIAGNOSIS — I248 Other forms of acute ischemic heart disease: Secondary | ICD-10-CM | POA: Diagnosis present

## 2014-02-12 HISTORY — PX: LEFT HEART CATHETERIZATION WITH CORONARY ANGIOGRAM: SHX5451

## 2014-02-12 LAB — PRO B NATRIURETIC PEPTIDE: PRO B NATRI PEPTIDE: 938.3 pg/mL — AB (ref 0–125)

## 2014-02-12 LAB — CBC
HCT: 37.9 % (ref 36.0–46.0)
HEMATOCRIT: 33.1 % — AB (ref 36.0–46.0)
HEMOGLOBIN: 11.4 g/dL — AB (ref 12.0–15.0)
Hemoglobin: 13.4 g/dL (ref 12.0–15.0)
MCH: 31.1 pg (ref 26.0–34.0)
MCH: 30.9 pg (ref 26.0–34.0)
MCHC: 35.4 g/dL (ref 30.0–36.0)
MCHC: 34.4 g/dL (ref 30.0–36.0)
MCV: 87.9 fL (ref 78.0–100.0)
MCV: 89.7 fL (ref 78.0–100.0)
PLATELETS: 281 10*3/uL (ref 150–400)
Platelets: 226 10*3/uL (ref 150–400)
RBC: 4.31 MIL/uL (ref 3.87–5.11)
RBC: 3.69 MIL/uL — ABNORMAL LOW (ref 3.87–5.11)
RDW: 12.5 % (ref 11.5–15.5)
RDW: 12.7 % (ref 11.5–15.5)
WBC: 7.8 10*3/uL (ref 4.0–10.5)
WBC: 7.3 10*3/uL (ref 4.0–10.5)

## 2014-02-12 LAB — TROPONIN I
TROPONIN I: 5.94 ng/mL — AB (ref <0.30)
Troponin I: 2.61 ng/mL (ref <0.30)

## 2014-02-12 LAB — COMPREHENSIVE METABOLIC PANEL
ALT: 16 U/L (ref 0–35)
AST: 29 U/L (ref 0–37)
Albumin: 3.9 g/dL (ref 3.5–5.2)
Alkaline Phosphatase: 131 U/L — ABNORMAL HIGH (ref 39–117)
Anion gap: 14 (ref 5–15)
BUN: 11 mg/dL (ref 6–23)
CALCIUM: 9.5 mg/dL (ref 8.4–10.5)
CO2: 23 mEq/L (ref 19–32)
Chloride: 107 mEq/L (ref 96–112)
Creatinine, Ser: 0.76 mg/dL (ref 0.50–1.10)
GFR calc Af Amer: >90 mL/min (ref >90)
GFR calc non Af Amer: 85 mL/min — ABNORMAL LOW (ref >90)
Glucose, Bld: 129 mg/dL — ABNORMAL HIGH (ref 70–99)
Potassium: 3.2 mEq/L — ABNORMAL LOW (ref 3.7–5.3)
SODIUM: 144 meq/L (ref 137–147)
TOTAL PROTEIN: 6.9 g/dL (ref 6.0–8.3)
Total Bilirubin: 0.4 mg/dL (ref 0.3–1.2)

## 2014-02-12 LAB — MRSA PCR SCREENING: MRSA BY PCR: NEGATIVE

## 2014-02-12 LAB — LIPID PANEL
CHOL/HDL RATIO: 2.6 ratio
CHOLESTEROL: 168 mg/dL (ref 0–200)
HDL: 64 mg/dL (ref >39)
LDL Cholesterol: 88 mg/dL (ref 0–99)
Triglycerides: 82 mg/dL (ref <150)
VLDL: 16 mg/dL (ref 0–40)

## 2014-02-12 LAB — POTASSIUM: POTASSIUM: 4.2 meq/L (ref 3.7–5.3)

## 2014-02-12 LAB — PROTIME-INR
INR: 1.09 (ref 0.00–1.49)
Prothrombin Time: 14.1 seconds (ref 11.6–15.2)

## 2014-02-12 LAB — POCT ACTIVATED CLOTTING TIME: ACTIVATED CLOTTING TIME: 112 s

## 2014-02-12 LAB — APTT: APTT: 88 s — AB (ref 24–37)

## 2014-02-12 LAB — CREATININE, SERUM
Creatinine, Ser: 0.76 mg/dL (ref 0.50–1.10)
GFR, EST NON AFRICAN AMERICAN: 85 mL/min — AB (ref >90)

## 2014-02-12 LAB — HEMOGLOBIN A1C
Hgb A1c MFr Bld: 5.4 % (ref <5.7)
Mean Plasma Glucose: 108 mg/dL (ref <117)

## 2014-02-12 LAB — TSH: TSH: 1.13 u[IU]/mL (ref 0.350–4.500)

## 2014-02-12 LAB — MAGNESIUM: MAGNESIUM: 2.2 mg/dL (ref 1.5–2.5)

## 2014-02-12 SURGERY — LEFT HEART CATHETERIZATION WITH CORONARY ANGIOGRAM
Anesthesia: LOCAL

## 2014-02-12 MED ORDER — METOPROLOL TARTRATE 12.5 MG HALF TABLET
12.5000 mg | ORAL_TABLET | Freq: Two times a day (BID) | ORAL | Status: DC
  Administered 2014-02-12 (×2): 12.5 mg via ORAL
  Filled 2014-02-12 (×4): qty 1

## 2014-02-12 MED ORDER — NITROGLYCERIN 1 MG/10 ML FOR IR/CATH LAB
INTRA_ARTERIAL | Status: AC
  Filled 2014-02-12: qty 10

## 2014-02-12 MED ORDER — HEPARIN SODIUM (PORCINE) 5000 UNIT/ML IJ SOLN
5000.0000 [IU] | Freq: Three times a day (TID) | INTRAMUSCULAR | Status: DC
  Administered 2014-02-13 – 2014-02-14 (×3): 5000 [IU] via SUBCUTANEOUS
  Filled 2014-02-12 (×6): qty 1

## 2014-02-12 MED ORDER — MIDAZOLAM HCL 2 MG/2ML IJ SOLN
INTRAMUSCULAR | Status: AC
  Filled 2014-02-12: qty 2

## 2014-02-12 MED ORDER — PANTOPRAZOLE SODIUM 40 MG PO TBEC
80.0000 mg | DELAYED_RELEASE_TABLET | Freq: Every day | ORAL | Status: DC
  Administered 2014-02-12 – 2014-02-13 (×2): 80 mg via ORAL
  Filled 2014-02-12 (×4): qty 2

## 2014-02-12 MED ORDER — LIDOCAINE HCL (PF) 1 % IJ SOLN
INTRAMUSCULAR | Status: AC
  Filled 2014-02-12: qty 30

## 2014-02-12 MED ORDER — NITROGLYCERIN IN D5W 200-5 MCG/ML-% IV SOLN
2.0000 ug/min | INTRAVENOUS | Status: DC
  Administered 2014-02-12: 03:00:00 40 ug/min via INTRAVENOUS
  Administered 2014-02-12 (×2): 60 ug/min via INTRAVENOUS
  Administered 2014-02-12: 50 ug/min via INTRAVENOUS
  Administered 2014-02-12: 30 ug/min via INTRAVENOUS
  Administered 2014-02-12: 10 ug/min via INTRAVENOUS
  Administered 2014-02-12: 02:00:00 20 ug/min via INTRAVENOUS
  Administered 2014-02-12: 70 ug/min via INTRAVENOUS

## 2014-02-12 MED ORDER — LOSARTAN POTASSIUM 25 MG PO TABS
25.0000 mg | ORAL_TABLET | Freq: Every day | ORAL | Status: DC
  Administered 2014-02-12 – 2014-02-14 (×3): 25 mg via ORAL
  Filled 2014-02-12 (×3): qty 1

## 2014-02-12 MED ORDER — ACETAMINOPHEN 325 MG PO TABS: 650.0000 mg | ORAL_TABLET | ORAL | Status: DC | PRN

## 2014-02-12 MED ORDER — ASPIRIN 81 MG PO CHEW: 324.0000 mg | CHEWABLE_TABLET | ORAL | Status: AC

## 2014-02-12 MED ORDER — ATORVASTATIN CALCIUM 80 MG PO TABS
80.0000 mg | ORAL_TABLET | Freq: Every day | ORAL | Status: DC
  Administered 2014-02-12 – 2014-02-13 (×2): 80 mg via ORAL
  Filled 2014-02-12 (×3): qty 1

## 2014-02-12 MED ORDER — NITROGLYCERIN 0.4 MG SL SUBL
0.4000 mg | SUBLINGUAL_TABLET | SUBLINGUAL | Status: DC | PRN
  Administered 2014-02-12 (×2): 0.4 mg via SUBLINGUAL
  Filled 2014-02-12 (×3): qty 1

## 2014-02-12 MED ORDER — NITROGLYCERIN IN D5W 200-5 MCG/ML-% IV SOLN
INTRAVENOUS | Status: AC
  Administered 2014-02-12: 02:00:00 10 ug/min via INTRAVENOUS
  Filled 2014-02-12: qty 250

## 2014-02-12 MED ORDER — ONDANSETRON HCL 4 MG/2ML IJ SOLN: 4.0000 mg | Freq: Four times a day (QID) | INTRAMUSCULAR | Status: DC | PRN

## 2014-02-12 MED ORDER — HEPARIN (PORCINE) IN NACL 2-0.9 UNIT/ML-% IJ SOLN
INTRAMUSCULAR | Status: AC
  Filled 2014-02-12: qty 500

## 2014-02-12 MED ORDER — SODIUM CHLORIDE 0.9 % IV SOLN
INTRAVENOUS | Status: DC
  Administered 2014-02-12 (×2): via INTRAVENOUS

## 2014-02-12 MED ORDER — ASPIRIN EC 81 MG PO TBEC
81.0000 mg | DELAYED_RELEASE_TABLET | Freq: Every day | ORAL | Status: DC
  Administered 2014-02-13 – 2014-02-14 (×2): 81 mg via ORAL
  Filled 2014-02-12 (×2): qty 1

## 2014-02-12 MED ORDER — SODIUM CHLORIDE 0.9 % IV SOLN
INTRAVENOUS | Status: DC
  Administered 2014-02-12: 09:00:00 via INTRAVENOUS

## 2014-02-12 MED ORDER — HEPARIN (PORCINE) IN NACL 2-0.9 UNIT/ML-% IJ SOLN
INTRAMUSCULAR | Status: AC
  Filled 2014-02-12: qty 1000

## 2014-02-12 MED ORDER — ASPIRIN EC 81 MG PO TBEC: 81.0000 mg | DELAYED_RELEASE_TABLET | Freq: Every day | ORAL | Status: DC

## 2014-02-12 MED ORDER — POTASSIUM CHLORIDE CRYS ER 20 MEQ PO TBCR
40.0000 meq | EXTENDED_RELEASE_TABLET | Freq: Once | ORAL | Status: AC
  Administered 2014-02-12: 40 meq via ORAL
  Filled 2014-02-12: qty 2

## 2014-02-12 MED ORDER — PANTOPRAZOLE SODIUM 40 MG PO TBEC: 80.0000 mg | DELAYED_RELEASE_TABLET | Freq: Every day | ORAL | Status: DC

## 2014-02-12 MED ORDER — ASPIRIN 81 MG PO CHEW
81.0000 mg | CHEWABLE_TABLET | ORAL | Status: AC
  Administered 2014-02-12: 81 mg via ORAL
  Filled 2014-02-12: qty 1

## 2014-02-12 MED ORDER — FENTANYL CITRATE 0.05 MG/ML IJ SOLN
INTRAMUSCULAR | Status: AC
  Filled 2014-02-12: qty 2

## 2014-02-12 MED ORDER — HEPARIN (PORCINE) IN NACL 100-0.45 UNIT/ML-% IJ SOLN
1000.0000 [IU]/h | INTRAMUSCULAR | Status: DC
  Administered 2014-02-11: 1000 [IU]/h via INTRAVENOUS
  Filled 2014-02-12: qty 250

## 2014-02-12 MED ORDER — SODIUM CHLORIDE 0.9 % IJ SOLN: 3.0000 mL | INTRAMUSCULAR | Status: DC | PRN

## 2014-02-12 MED ORDER — ADULT MULTIVITAMIN W/MINERALS CH
1.0000 | ORAL_TABLET | Freq: Every day | ORAL | Status: DC
  Administered 2014-02-12 – 2014-02-14 (×3): 1 via ORAL
  Filled 2014-02-12 (×3): qty 1

## 2014-02-12 MED ORDER — VITAMIN D3 25 MCG (1000 UNIT) PO TABS
1000.0000 [IU] | ORAL_TABLET | Freq: Every day | ORAL | Status: DC
  Administered 2014-02-12 – 2014-02-14 (×3): 1000 [IU] via ORAL
  Filled 2014-02-12 (×3): qty 1

## 2014-02-12 MED ORDER — ASPIRIN 300 MG RE SUPP
300.0000 mg | RECTAL | Status: AC
  Filled 2014-02-12: qty 1

## 2014-02-12 MED ORDER — SODIUM CHLORIDE 0.9 % IV SOLN: 250.0000 mL | INTRAVENOUS | Status: DC | PRN

## 2014-02-12 MED ORDER — SODIUM CHLORIDE 0.9 % IJ SOLN: 3.0000 mL | Freq: Two times a day (BID) | INTRAMUSCULAR | Status: DC

## 2014-02-12 NOTE — Progress Notes (Signed)
UR completed Laporche Martelle K. Kaileena Obi, RN, BSN, MSHL, CCM  02/12/2014 10:59 AM

## 2014-02-12 NOTE — Care Management Note (Addendum)
  Page 1 of 1   02/14/2014     2:16:57 PM CARE MANAGEMENT NOTE 02/14/2014  Patient:  Nancy Winters, Nancy Winters   Account Number:  [id number]  Date Initiated:  02/12/2014  Documentation initiated by:  Mariann Laster  Subjective/Objective Assessment:   NSTEMI     Action/Plan:   CM to follow for disposition needs   Anticipated DC Date:  02/12/2014   Anticipated DC Plan:  [place]  CM consult      Choice offered to / List presented to:             Status of service:  Completed, signed off Medicare Important Message given?  YES (If response is "NO", the following Medicare IM given date fields will be blank) Date Medicare IM given:  02/14/2014 Medicare IM given by:  Elaysia Devargas Date Additional Medicare IM given:   Additional Medicare IM given by:    Discharge Disposition:  HOME/SELF CARE  Per UR Regulation:  Reviewed for med. necessity/level of care/duration of stay  If discussed at [place] of Stay Meetings, dates discussed:    Comments:  Tammey Deeg RN, BSN, MSHL, CCM  Nurse - Case Manager, (Unit [phone]  02/14/2014 Follow-up Information   Follow up with Jory Sims, NP On 03/18/2014. (1:10pm)  [facility] [place]   Specialty: Nurse Practitioner   Contact information:   [address] [place] [address] [phone]

## 2014-02-12 NOTE — Progress Notes (Signed)
Site area: right groin Site Prior to Removal:  Level 0 Pressure Applied For:20 minutes Manual:   yes Patient Status During Pull:  stable Post Pull Site:  Level  0 Post Pull Instructions Given:  yes Post Pull Pulses Present: yes and remained palpable during sheath pull Dressing Applied:  tegaderm Bedrest begins @ 0900 Comments: no complications

## 2014-02-12 NOTE — Plan of Care (Addendum)
Problem: Phase I Progression Outcomes Goal: Aspirin unless contraindicated Outcome: Completed/Met Date Met:  02/12/14 Verified in chart pt received 324 ASA per EMS according to [facility] ER record.

## 2014-02-12 NOTE — Progress Notes (Signed)
Pt denies cp or sob, just "tired."  Dr. Tommi Rumps aware of Troponin 5.94 and viewed repeat EKG's in EPIC.  NTG gtt titrated up to 60 mcg/min with no obvious change.  Tele SR 80-90's with 6 beats VT, pt asymptomatic.  K+ 3.2, will replace PO.  Resting quietly. BP 120/57.  NPO for 1st case cath.

## 2014-02-12 NOTE — Progress Notes (Signed)
Pt went to cath lab first case this am and was treated by Dr Claiborne Billings. I did not see him today.  Sherren Mocha

## 2014-02-12 NOTE — Progress Notes (Signed)
SL NTG given for c/o midsternal Chest pressure 2/10.  ST elevation noted on tele Mx.  EKG done just after second NTG given.  Patient states pain "pretty much gone".  ST elevation noted on EKG, informed Dr. Tommi Rumps.  BP 118/78.  EKG repeated, Dr Tommi Rumps notified of ST elevation and EKG labelled STEMI.  Order received to start NTG gtt and increase as BP will tolerate and repeat EKG 30 minutes later.  Labs drawn.  Pt continues to deny pain at this time.  Pt and daughter updated w/ POC.

## 2014-02-12 NOTE — Interval H&P Note (Signed)
Cath Lab Visit (complete for each Cath Lab visit)  Clinical Evaluation Leading to the Procedure:   ACS: Yes.    Non-ACS:    Anginal Classification: CCS IV  Anti-ischemic medical therapy: Maximal Therapy (2 or more classes of medications)  Non-Invasive Test Results: No non-invasive testing performed  Prior CABG: No previous CABG      History and Physical Interval Note:  02/12/2014 7:43 AM  Nancy Winters  has presented today for surgery, with the diagnosis of NSTEMI  The various methods of treatment have been discussed with the patient and family. After consideration of risks, benefits and other options for treatment, the patient has consented to  Procedure(s): LEFT HEART CATHETERIZATION WITH CORONARY ANGIOGRAM (N/A) as a surgical intervention .  The patient's history has been reviewed, patient examined, no change in status, stable for surgery.  I have reviewed the patient's chart and labs.  Questions were answered to the patient's satisfaction.     Lynford Espinoza A

## 2014-02-12 NOTE — Progress Notes (Signed)
Received into 6C02 per EMS, denies complaints until she walked <10 ft to bathroom and then said chest pressure returned, 1/10, non-radiating.  O2 sat 99% on RA, Placed back on O2 2L for comfort.  Heparin gtt infusing at 1000 units/hr.  Placed on tele, SR 70's.  BP 133/74.  H&P completed, Jehovah's witness, does not want blood products.  Pt states CP comes and goes, gone now.  Oriented to room, call light in reach.  Daughter at bedside.  Questions answered.  Dr. Tommi Rumps here to examine.

## 2014-02-12 NOTE — CV Procedure (Signed)
Nancy Winters is a 68 y.o. female    [id number]  [id number] LOCATION:  FACILITY: [place]  PHYSICIAN: Troy Sine, MD, [facility] [birth year]   DATE OF PROCEDURE:  02/12/2014    CARDIAC CATHETERIZATION     HISTORY:    Nancy Winters is a 68 y.o. female who has a history of hypertension, GERD, and presented last evening with chest pain, and has ruled in with a non-ST segment elevation myocardial infarction with a peak troponin of 5.94.  She now presents for definitive cardiac catheterization.   PROCEDURE: Left heart catheterization: Coronary angiography, IC nitroglycerin administration into the left coronary system, left ventriculography.  The patient was brought to the [facility] cardiac catherization labaratory in the postabsorptive state. She was premedicated with versed 1 mg and Fetanyl 25 micrograms.  Her right groin was prepped and shaved in usual sterile fashion. Xylocaine 1% was used for local anesthesia. A 5 French sheath was inserted into the R femoral artery. Diagnostic catheterizatiion was done with 5 [place] FL4, FR4, and pigtail catheters. Left ventriculography was done with 24 cc Omnipaque contrast. The Left catheter was re-inserted, 200 micrograms IC NTG was administered and pre and post NTG angiography was done to assess for possible ostial LAD spasm in the etiology of her wall motion abnormality.   Hemostasis was obtained by direct manual compression. The patient tolerated the procedure well.   HEMODYNAMICS:   Central Aorta: 98/57   Left Ventricle: 98/16  ANGIOGRAPHY:  Left main: Angiographically normal short vessel which bifurcated into the LAD and left circumflex coronary artery  LAD: Moderate size vessel that is smooth 20% ostial narrowing.  The vessel ended in an LAD diagonal twin-like system.  The ostial LAD smooth narrowing did not significantly improve with IC nitroglycerin administration  Left circumflex: Angiographically normal vessel.  Right coronary  artery: Angiographically normal vessel.  Left ventriculography revealed features suggestive of possible Takotsubo cardiomyopathy.  Ejection fraction was approximately 35%.  There is vigorous contractility of the basal walls.  There is apical ballooning extending from the mid anterolateral wall, apex to the mid inferior wall.  There is a suggestion of mild mitral valve prolapse   IMPRESSION:  Probable nonischemic cardiomyopathy with features suggestive of Takotsubo cardiomyopathy with an ejection fraction of ~35%.  No significant coronary obstructive disease with evidence for smooth ostial narrowing of the LAD of 20%, not significantly improved with IC nitroglycerin administration.  RECOMMENDATION:  Ms. Brunner has ruled in for non-ST segment elevation myocardial infarction.  Her left ventriculogram is highly suggestive of Takotsubo cardiomyopathy.  Alternatively, it is possible she may have developed transient vasospasm of her LAD at the ostium, which improved with nitroglycerin administration.  However, her LAD is not a "wrap around LAD" and the wall motion abnormality extends to the mid inferior wall.  Upon arrival to the catheterization laboratory she was on 50 mcg of intracoronary nitroglycerin and may not have benefited by the additional IC administration during the procedure, despite being off the IV dose at the time of administration. Increasing medical therapy will be recommended for her cardiomyopathy with hopeful improvement in LV function.  Troy Sine, MD, [facility] 02/12/2014 8:22 AM

## 2014-02-12 NOTE — Progress Notes (Signed)
HR increasing over past few hours SR-ST 90-100's. BP 108/52 on 70 mcg/min NTG gtt.  Pt had another episode of VT 9 beats, Nancy Fuller PA informed, see strip.  Consent signed.  Groin and rt wrist clipped per NT.  PO ASA and Metoprolol given, NTG gtt decreased to 60 mcg/min.  Heparin gtt d/c'd.  To cath lab per stretcher.

## 2014-02-12 NOTE — Progress Notes (Signed)
Subjective:  No CP or SOB currently.  Objective: Vital signs in last 24 hours: Temp:  [97.6 F (36.4 C)-98.1 F (36.7 C)] 97.6 F (36.4 C) (08/25 6503) Pulse Rate:  [67-108] 108 (08/25 0613) Resp:  [18-20] 20 (08/25 0613) BP: (96-135)/(52-78) 108/52 mmHg (08/25 0613) SpO2:  [99 %-100 %] 99 % (08/25 0613) Weight:  [149 lb 14.6 oz (68 kg)] 149 lb 14.6 oz (68 kg) (08/25 0103) Last BM Date: 02/11/14  Intake/Output from previous day: 08/24 0701 - 08/25 0700 In: -  Out: 400 [Urine:400] Intake/Output this shift: Total I/O In: -  Out: 400 [Urine:400]  Medications Current Facility-Administered Medications  Medication Dose Route Frequency Provider Last Rate Last Dose  . 0.9 %  sodium chloride infusion   Intravenous Continuous Troy Sine, MD      . acetaminophen (TYLENOL) tablet 650 mg  650 mg Oral Q4H PRN Lamar Sprinkles, MD      . Derrill Memo ON 02/13/2014] aspirin EC tablet 81 mg  81 mg Oral Daily Lamar Sprinkles, MD      . atorvastatin (LIPITOR) tablet 80 mg  80 mg Oral q1800 Lamar Sprinkles, MD      . cholecalciferol (VITAMIN D) tablet 1,000 Units  1,000 Units Oral Daily Lamar Sprinkles, MD      . heparin ADULT infusion 100 units/mL (25000 units/250 mL)  1,000 Units/hr Intravenous Continuous Rogue Bussing, [facility] 10 mL/hr at 02/11/14 2330 1,000 Units/hr at 02/11/14 2330  . losartan (COZAAR) tablet 25 mg  25 mg Oral Daily Lamar Sprinkles, MD      . metoprolol tartrate (LOPRESSOR) tablet 12.5 mg  12.5 mg Oral BID Lamar Sprinkles, MD      . multivitamin with minerals tablet 1 tablet  1 tablet Oral Daily Lamar Sprinkles, MD      . nitroGLYCERIN (NITROSTAT) SL tablet 0.4 mg  0.4 mg Sublingual Q5 Min x 3 PRN Lamar Sprinkles, MD   0.4 mg at 02/12/14 0106  . nitroGLYCERIN 50 mg in dextrose 5 % 250 mL (0.2 mg/mL) infusion  2-200 mcg/min Intravenous Titrated Lamar Sprinkles, MD 21 mL/hr at 02/12/14 0408 70 mcg/min at 02/12/14 0408  . ondansetron (ZOFRAN) injection 4 mg  4 mg  Intravenous Q6H PRN Lamar Sprinkles, MD      . pantoprazole (PROTONIX) EC tablet 80 mg  80 mg Oral Q1200 Lamar Sprinkles, MD        PE: General appearance: alert, cooperative and no distress Lungs: clear to auscultation bilaterally Heart: regular rate and rhythm, S1, S2 normal, no murmur, click, rub or gallop Extremities: Trace LEE Pulses: 2+ and symmetric Skin: WArm and dry Neurologic: Grossly normal  Lab Results:   Recent Labs  02/12/14 0251  WBC 7.8  HGB 13.4  HCT 37.9  PLT 281   BMET  Recent Labs  02/12/14 0215  NA 144  K 3.2*  CL 107  CO2 23  GLUCOSE 129*  BUN 11  CREATININE 0.76  CALCIUM 9.5   PT/INR  Recent Labs  02/12/14 0215  LABPROT 14.1  INR 1.09   Cholesterol  Recent Labs  02/12/14 0215  CHOL 168   Lipid Panel     Component Value Date/Time   CHOL 168 02/12/2014 0215   TRIG 82 02/12/2014 0215   HDL 64 02/12/2014 0215   CHOLHDL 2.6 02/12/2014 0215   VLDL 16 02/12/2014 0215   LDLCALC 88 02/12/2014 0215   Cardiac Panel (last 3 results)  Recent Labs  02/12/14 0215  TROPONINI 5.94*  Assessment/Plan  56F Jehovah's Witness with a history of HTN, GERD, recurrent UTIs in setting of uterine prolapse who p/w NSTEMI.  Principal Problem:   NSTEMI (non-ST elevated myocardial infarction)   Hypokalemia   NSTEMI     NSVT   HTN  Plan:  On IV heparin, NTG, BB, ASA, cozaar.  No CP or SOB currently.  Left heart cath first this morning.  Replaced K+.  She is having short runs of NSVT.  RN will give lopressor now.  May need to back off NTG as BP is on the low side.   LOS: 1 day    Takina Busser PA-C 02/12/2014 6:21 AM

## 2014-02-12 NOTE — Progress Notes (Signed)
ANTICOAGULATION CONSULT NOTE - Initial Consult  Pharmacy Consult for heparin Indication: chest pain/ACS  Allergies  Allergen Reactions  . Ciprofloxacin     myalgia    Patient Measurements: Height: 5\' 6"  (167.6 cm) IBW/kg (Calculated) : 59.3 Weight 68kg  Vital Signs: Temp: 98.1 F (36.7 C) (08/24 2313) Temp src: Oral (08/24 2313) BP: 125/67 mmHg (08/25 0103) Pulse Rate: 67 (08/24 2313)   Medical History: Past Medical History  Diagnosis Date  . GERD (gastroesophageal reflux disease)   . Hypertension   . Shortness of breath     for about a year    Medications:  Prescriptions prior to admission  Medication Sig Dispense Refill  . cholecalciferol (VITAMIN D) 1000 UNITS tablet Take 1,000 Units by mouth daily.      Marland Kitchen losartan (COZAAR) 25 MG tablet Take 25 mg by mouth daily.      Marland Kitchen CRANBERRY PO Take 2 capsules by mouth daily.      Marland Kitchen esomeprazole (NEXIUM) 40 MG capsule Take 40 mg by mouth daily at 12 noon.      . Multiple Vitamins-Minerals (MULTIVITAMINS THER. W/MINERALS) TABS tablet Take 1 tablet by mouth daily.      . ranitidine (ZANTAC) 300 MG tablet Take 300 mg by mouth at bedtime.      . [DISCONTINUED] Glucosamine Sulfate-MSM (GLUCOSAMINE-MSM DS PO) Take 1 tablet by mouth 3 (three) times daily.       Scheduled:  . aspirin  324 mg Oral NOW   Or  . aspirin  300 mg Rectal NOW  . [START ON 02/13/2014] aspirin EC  81 mg Oral Daily  . atorvastatin  80 mg Oral q1800  . cholecalciferol  1,000 Units Oral Daily  . losartan  25 mg Oral Daily  . metoprolol tartrate  12.5 mg Oral BID  . multivitamin with minerals  1 tablet Oral Daily  . pantoprazole  80 mg Oral Q1200    Assessment: 68yo female presented to [facility] ED c/o CP, requested tx to [facility], troponin elevated and started on heparin prior to tx, to continue; pt continues to c/o CP intermittently w/ relief after SL NTG.  Goal of Therapy:  Heparin level 0.3-0.7 units/ml Monitor platelets by anticoagulation protocol:  Yes   Plan:  Pt rec'd heparin 4000 units IV bolus x1 at OSH; per the report RN rec'd pt was getting 1000 units/hr of heparin but paper chart shows 816 units/hr; unclear which rate was initially started but pt has definitely been on 1000 units/hr since ~MN when arrived to floor; will continue for now and monitor heparin levels and CBC.  Wynona Neat, PharmD, BCPS  02/12/2014,1:35 AM

## 2014-02-13 ENCOUNTER — Encounter (HOSPITAL_COMMUNITY): Payer: Self-pay | Admitting: Cardiology

## 2014-02-13 DIAGNOSIS — I428 Other cardiomyopathies: Secondary | ICD-10-CM | POA: Diagnosis present

## 2014-02-13 DIAGNOSIS — I5181 Takotsubo syndrome: Principal | ICD-10-CM

## 2014-02-13 DIAGNOSIS — I2510 Atherosclerotic heart disease of native coronary artery without angina pectoris: Secondary | ICD-10-CM

## 2014-02-13 DIAGNOSIS — I219 Acute myocardial infarction, unspecified: Secondary | ICD-10-CM

## 2014-02-13 HISTORY — DX: Takotsubo syndrome: I51.81

## 2014-02-13 LAB — CBC
HEMATOCRIT: 35.5 % — AB (ref 36.0–46.0)
Hemoglobin: 11.8 g/dL — ABNORMAL LOW (ref 12.0–15.0)
MCH: 29.8 pg (ref 26.0–34.0)
MCHC: 33.2 g/dL (ref 30.0–36.0)
MCV: 89.6 fL (ref 78.0–100.0)
Platelets: 233 10*3/uL (ref 150–400)
RBC: 3.96 MIL/uL (ref 3.87–5.11)
RDW: 12.6 % (ref 11.5–15.5)
WBC: 7.3 10*3/uL (ref 4.0–10.5)

## 2014-02-13 MED ORDER — METOPROLOL TARTRATE 25 MG PO TABS
25.0000 mg | ORAL_TABLET | Freq: Two times a day (BID) | ORAL | Status: DC
  Administered 2014-02-13 – 2014-02-14 (×3): 25 mg via ORAL
  Filled 2014-02-13 (×2): qty 1

## 2014-02-13 NOTE — Progress Notes (Signed)
CARDIAC REHAB PHASE I   PRE:  Rate/Rhythm: 90SR  BP:  Supine: 96/55  Sitting:   Standing:    SaO2:   MODE:  Ambulation: 650 ft   POST:  Rate/Rhythm: 121 ST  BP:  Supine: 107/46  Sitting:   Standing:    SaO2:  0810-0910 Pt walked 650 ft independently with steady gait. Heart rate elevated with activity. Education completed re CHF, takot subo cardiomyopathy, MI ,exercise,daily weights and low sodiuim 2000 mg. Gave CHF booklet and reviewed zones. Discussed CRP 2 and pt gave permission to refer to [facility] Phase 2.   Graylon Good, RN BSN  02/13/2014 9:05 AM

## 2014-02-13 NOTE — Progress Notes (Signed)
  Echocardiogram 2D Echocardiogram has been performed.  Nancy Winters [name] 02/13/2014, 10:55 AM

## 2014-02-13 NOTE — Progress Notes (Signed)
Nancy Winters is a 67 y.o. female who has a history of hypertension, GERD, and presented 02/11/14 evening with chest pain, and has ruled in with a non-ST segment elevation myocardial infarction with a peak troponin of 5.94. Cardiac catheterization Probable nonischemic cardiomyopathy with features suggestive of Takotsubo cardiomyopathy with an ejection fraction of ~35%.  No significant coronary obstructive disease with evidence for smooth ostial narrowing of the LAD of 20%, not significantly improved with IC nitroglycerin administration.     Subjective:  no chest pain, no SOB.  Objective: Vital signs in last 24 hours: Temp:  [98 F (36.7 C)-98.4 F (36.9 C)] 98.1 F (36.7 C) (08/26 0642) Pulse Rate:  [63-98] 82 (08/26 0642) Resp:  [15-23] 20 (08/26 0642) BP: (85-129)/(45-65) 114/59 mmHg (08/26 0642) SpO2:  [95 %-100 %] 99 % (08/26 0642) Weight:  [151 lb 7.3 oz (68.7 kg)] 151 lb 7.3 oz (68.7 kg) (08/26 0012) Weight change: 1 lb 8.7 oz (0.7 kg) Last BM Date: 02/11/14 Intake/Output from previous day: -400 08/25 0701 - 08/26 0700 In: 2342.1 [P.O.:840; I.V.:1502.1] Out: 2250 [Urine:2250] Intake/Output this shift:    PE: General:Pleasant affect, NAD Skin:Warm and dry, brisk capillary refill HEENT:normocephalic, sclera clear, mucus membranes moist Neck:supple, no JVD, no bruits  Heart:S1S2 RRR without murmur, gallup, rub or click Lungs:clear without rales, rhonchi, or wheezes YWV:PXTG, non tender, + BS, do not palpate liver spleen or masses Ext:no lower ext edema, 2+ pedal pulses, 2+ radial pulses, rt groin cath site without hematoma.  Neuro:alert and oriented, MAE, follows commands, + facial symmetry   Lab Results:  Recent Labs  02/12/14 1129 02/13/14 0321  WBC 7.3 7.3  HGB 11.4* 11.8*  HCT 33.1* 35.5*  PLT 226 233   BMET  Recent Labs  02/12/14 0215 02/12/14 1129  NA 144  --   K 3.2* 4.2  CL 107  --   CO2 23  --   GLUCOSE 129*  --   BUN 11  --     CREATININE 0.76 0.76  CALCIUM 9.5  --     Recent Labs  02/12/14 0215 02/12/14 1129  TROPONINI 5.94* 2.61*    Lab Results  Component Value Date   CHOL 168 02/12/2014   HDL 64 02/12/2014   LDLCALC 88 02/12/2014   TRIG 82 02/12/2014   CHOLHDL 2.6 02/12/2014   Lab Results  Component Value Date   HGBA1C 5.4 02/12/2014     Lab Results  Component Value Date   TSH 1.130 02/12/2014    Hepatic Function Panel  Recent Labs  02/12/14 0215  PROT 6.9  ALBUMIN 3.9  AST 29  ALT 16  ALKPHOS 131*  BILITOT 0.4    Recent Labs  02/12/14 0215  CHOL 168   No results found for this basename: PROTIME,  in the last 72 hours     Studies/Results: No results found.  Medications: I have reviewed the patient's current medications. Scheduled Meds: . aspirin EC  81 mg Oral Daily  . atorvastatin  80 mg Oral q1800  . cholecalciferol  1,000 Units Oral Daily  . heparin  5,000 Units Subcutaneous 3 times per day  . losartan  25 mg Oral Daily  . metoprolol tartrate  12.5 mg Oral BID  . multivitamin with minerals  1 tablet Oral Daily  . pantoprazole  80 mg Oral Q1200   Continuous Infusions: . sodium chloride 75 mL/hr at 02/12/14 2300  . sodium chloride 125 mL/hr at 02/12/14 0845  .  nitroGLYCERIN 60 mcg/min (02/12/14 0650)   PRN Meds:.acetaminophen, nitroGLYCERIN, ondansetron (ZOFRAN) IV  Assessment/Plan: Principal Problem:   NSTEMI (non-ST elevated myocardial infarction)- secondary to Takotsubo Active Problems:   Takotsubo syndrome, EF 35% on cath   S/P cardiac cath, non obstructive CAD   Non Ischemic cardiomyopathy EF 35 % in 05/2013 EF 60%.    PAT- 7 beats last PM,  NSVT last pm  LOS: 2 days   Time spent with pt. :15 minutes. [facility] R  Nurse Practitioner Certified Pager [phone] or after 5pm and on weekends call [phone] 02/13/2014, 7:39 AM   Patient seen and examined and history reviewed. Agree with above findings and plan. Patient feels very well today. Short run of  SVT last night- asymptomatic. Denies chest pain or SOB. Cath data reviewed. Findings consistent with Takotsubo cardiomyopathy. Will check Echo today. Titrate beta blocker dose. Observe with ambulation today. May be able to DC tomorrow.   Peter Martinique, [place] 02/13/2014 2:13 PM

## 2014-02-14 ENCOUNTER — Encounter (HOSPITAL_COMMUNITY): Payer: Self-pay | Admitting: Physician Assistant

## 2014-02-14 DIAGNOSIS — I5181 Takotsubo syndrome: Principal | ICD-10-CM

## 2014-02-14 LAB — CBC
HEMATOCRIT: 33 % — AB (ref 36.0–46.0)
HEMOGLOBIN: 11.2 g/dL — AB (ref 12.0–15.0)
MCH: 31.1 pg (ref 26.0–34.0)
MCHC: 33.9 g/dL (ref 30.0–36.0)
MCV: 91.7 fL (ref 78.0–100.0)
Platelets: 212 10*3/uL (ref 150–400)
RBC: 3.6 MIL/uL — ABNORMAL LOW (ref 3.87–5.11)
RDW: 12.6 % (ref 11.5–15.5)
WBC: 6.3 10*3/uL (ref 4.0–10.5)

## 2014-02-14 LAB — TROPONIN I: Troponin I: 0.55 ng/mL (ref <0.30)

## 2014-02-14 LAB — BASIC METABOLIC PANEL
Anion gap: 13 (ref 5–15)
BUN: 13 mg/dL (ref 6–23)
CHLORIDE: 107 meq/L (ref 96–112)
CO2: 21 meq/L (ref 19–32)
CREATININE: 0.86 mg/dL (ref 0.50–1.10)
Calcium: 9.2 mg/dL (ref 8.4–10.5)
GFR calc Af Amer: 79 mL/min — ABNORMAL LOW (ref >90)
GFR calc non Af Amer: 68 mL/min — ABNORMAL LOW (ref >90)
GLUCOSE: 107 mg/dL — AB (ref 70–99)
POTASSIUM: 4 meq/L (ref 3.7–5.3)
Sodium: 141 mEq/L (ref 137–147)

## 2014-02-14 LAB — PRO B NATRIURETIC PEPTIDE: PRO B NATRI PEPTIDE: 2777 pg/mL — AB (ref 0–125)

## 2014-02-14 MED ORDER — ASPIRIN 81 MG PO TBEC: 81.0000 mg | DELAYED_RELEASE_TABLET | Freq: Every day | ORAL | Status: AC

## 2014-02-14 MED ORDER — METOPROLOL TARTRATE 25 MG PO TABS: 25.0000 mg | ORAL_TABLET | Freq: Two times a day (BID) | ORAL | Status: DC

## 2014-02-14 NOTE — Progress Notes (Signed)
CARDIAC REHAB PHASE I   PRE:  Rate/Rhythm: 81SR  BP:  Supine: 113/51  Sitting:   Standing:    SaO2:   MODE:  Ambulation: 800 ft   POST:  Rate/Rhythm: 103 ST  BP:  Supine:   Sitting: 132/49  Standing:    SaO2:  0900-0917 Pt walked 800 ft with steady gait. Heart rate better controlled. Answered questions re activity at home.    Graylon Good, RN BSN  02/14/2014 9:14 AM

## 2014-02-14 NOTE — Discharge Instructions (Signed)
Cardiomyopathy Cardiomyopathy means a disease of the heart muscle. The heart muscle becomes enlarged or stiff. The heart is not able to pump enough blood or deliver enough oxygen to the body. This leads to heart failure and is the number one reason for heart transplants.  TYPES OF CARDIOMYOPATHY INCLUDE: DILATED  The most common type. The heart muscle is stretched out and weak so there is less blood pumped out.   Some causes:  Disease of the arteries of the heart (ischemia).  Heart attack with muscle scar.  Leaky or damaged valves.  After a viral illness.  Smoking.  High cholesterol.  Diabetes or overactive thyroid.  Alcohol or drug abuse.  High blood pressure.  May be reversible. HYPERTROPHIC The heart muscle grows bigger so there is less room for blood in the ventricle, and not enough blood is pumped out.   Causes include:  Mitral valve leaks.  Inherited tendency (from your family).  No explanation (idiopathic).  May be a cause of sudden death in young athletes with no symptoms. RESTRICTIVE The heart muscle becomes stiff, but not always larger. The heart has to work harder and will get weaker. Abnormal heart beats or rhythm (arrhythmia) are common.  Some causes:  Diseases in other parts of the body which may produce abnormal deposits in the heart muscle.  Probably not inherited.  A result of radiation treatment for cancer. SYMPTOMS OF ALL TYPES:  Less able to exercise or tolerate physical activity.  Palpitations.  Irregular heart beat, heart arrhythmias.  Shortness of breath, even at rest.  Chest pain.  Lightheadedness or fainting. TREATMENT  Life-style changes including reducing salt, lowering cholesterol, stop smoking.  Manage contributing causes with medications.  Medicines to help reduce the fluids in the body.  An implanted cardioverter defibrillator (ICD) to improve heart function and correct arrhythmias.  Medications to relax the blood  vessels and make it easier for the heart to pump.  Drugs that help regulate heart beat and improve heart relaxation, reducing the work of the heart.  Myomectomy for patients with hypertrophic cardiomyopathy and severe problems. This is a surgical procedure that removes a portion of the thickened muscle wall in order to improve heart output and provide symptom relief.  A heart transplant is an option in carefully applied circumstances. SEEK IMMEDIATE MEDICAL CARE IF:   You have severe chest pain, especially if the pain is crushing or pressure-like and spreads to the arms, back, neck, or jaw, or if you have sweating, feeling sick to your stomach (nausea), or shortness of breath. THIS IS AN EMERGENCY. Do not wait to see if the pain will go away. Get medical help at once. Call your local emergency services (911 in [place] DO NOT drive yourself to the hospital.  You develop severe shortness of breath.  You begin to cough up bloody sputum.  You are unable to sleep because you cannot breathe.  You gain weight due to fluid retention.  You develop painful swelling in your calf or leg.  You feel your heart racing and it does not go away or happens when you are resting. Document Released: 08/20/2004 Document Revised: 08/30/2011 Document Reviewed: 01/24/2008 [facility] Patient Information 2015 [place], [place]. This information is not intended to replace advice given to you by your health care provider. Make sure you discuss any questions you have with your health care provider.  No driving for 24 hours. No lifting over 5 lbs for 1 week. No sexual activity for 1 week. Keep procedure site  clean & dry. If you notice increased pain, swelling, bleeding or pus, call/return!  You may shower, but no soaking baths/hot tubs/pools for 1 week.

## 2014-02-14 NOTE — Discharge Summary (Signed)
Discharge Summary   Patient ID: Nancy Winters,  MRN: [id number], DOB/AGE: [birth year] 68 y.o.  Admit date: 02/11/2014 Discharge date: 02/14/2014  Primary Care Provider: No PCP Per Patient Primary Cardiologist: Dr. Domenic Polite  Discharge Diagnoses Principal Problem:   Demand ischemia 2/2 Takotsubo Active Problems:   GERD (gastroesophageal reflux disease)   History of TIA (transient ischemic attack)   Takotsubo syndrome, EF 35% on cath   S/P cardiac cath, non obstructive CAD   NICM (nonischemic cardiomyopathy) EF 35%   Allergies Allergies  Allergen Reactions  . Ciprofloxacin     myalgia    Procedures  Echocardiogram 02/13/2014 LV EF: 40%  ------------------------------------------------------------------- Indications: MI - acute 410.91.  ------------------------------------------------------------------- History: PMH: Takotsubo syndrome. Non-ischemic cardiomyopathy.  ------------------------------------------------------------------- Study Conclusions  - Left ventricle: The cavity size was normal. Wall thickness was normal. The estimated ejection fraction was 40%. Severe hypokinesis to akinesis of the true apex and all the peri-apical segments. Other wall segments appear hyperkinetic. Doppler parameters are consistent with abnormal left ventricular relaxation (grade 1 diastolic dysfunction). - Aortic valve: There was no stenosis. - Mitral valve: There was no significant regurgitation. - Right ventricle: The cavity size was normal. Systolic function was normal. - Tricuspid valve: Peak RV-RA gradient (S): 17 mm Hg. - Pulmonary arteries: PA peak pressure: 20 mm Hg (S). - Inferior vena cava: The vessel was normal in size. The respirophasic diameter changes were in the normal range (>= 50%), consistent with normal central venous pressure. - Pericardium, extracardiac: A trivial pericardial effusion was identified.  Impressions:  - Normal LV size with EF 40%.  Severe hypokinesis to akinesis of the apex and all the peri-apical segments with hyperkinesis of the remaining segments. This is consistent with a stress (Takotsubo-type) cardiomyopathy. Normal RV size and systolic function. No significant valvular abnormalities.      Cardiac catheterization 02/12/2014  PROCEDURE: Left heart catheterization: Coronary angiography, IC nitroglycerin administration into the left coronary system, left ventriculography.  HEMODYNAMICS:  Central Aorta: 98/57  Left Ventricle: 98/16  ANGIOGRAPHY:  Left main: Angiographically normal short vessel which bifurcated into the LAD and left circumflex coronary artery  LAD: Moderate size vessel that is smooth 20% ostial narrowing. The vessel ended in an LAD diagonal twin-like system. The ostial LAD smooth narrowing did not significantly improve with IC nitroglycerin administration  Left circumflex: Angiographically normal vessel.  Right coronary artery: Angiographically normal vessel.  Left ventriculography revealed features suggestive of possible Takotsubo cardiomyopathy. Ejection fraction was approximately 35%. There is vigorous contractility of the basal walls. There is apical ballooning extending from the mid anterolateral wall, apex to the mid inferior wall. There is a suggestion of mild mitral valve prolapse  IMPRESSION:  Probable nonischemic cardiomyopathy with features suggestive of Takotsubo cardiomyopathy with an ejection fraction of ~35%.  No significant coronary obstructive disease with evidence for smooth ostial narrowing of the LAD of 20%, not significantly improved with IC nitroglycerin administration.  RECOMMENDATION:  Nancy Winters has ruled in for non-ST segment elevation myocardial infarction. Her left ventriculogram is highly suggestive of Takotsubo cardiomyopathy. Alternatively, it is possible she may have developed transient vasospasm of her LAD at the ostium, which improved with nitroglycerin  administration. However, her LAD is not a "wrap around LAD" and the wall motion abnormality extends to the mid inferior wall. Upon arrival to the catheterization laboratory she was on 50 mcg of intracoronary nitroglycerin and may not have benefited by the additional IC administration during the procedure, despite being off the IV dose at the time  of administration. Increasing medical therapy will be recommended for her cardiomyopathy with hopeful improvement in LV function.     Hospital Course  The patient is a 68 year old Nancy Winters with a history of hypertension and GERD who presented to [facility] on 02/11/2014 with substernal chest pain. Initial EKG showed ST elevation with prominent J-point in V3 to V6 without reciprocal changes. Troponin was elevated. She was ruled in for NSTEMI and underwent cardiac catheterization in the morning of 02/12/2014 which showed EF 35% consistent with a possible cardiomyopathy, 20% ostial LAD which did not significantly improve with intracoronary nitroglycerin administration. Post cath, patient had some nonsustained VT, and she was kept inpatient for one more day for observation purposes.  Patient was seen the morning of 02/14/2014, at which time she denies any significant chest pain or shortness of breath. She had episode of supraventricular tachycardia which was transient and did not recur overnight. She has been placed on metoprolol along with her home losartan. She is deemed stable for discharge. She will followup with Dr. Domenic Polite in our [place] office in one month. We will defer to Dr. Domenic Polite whether or not to obtain followup echo in 4-6 weeks.   Discharge Vitals Blood pressure 113/59, pulse 69, temperature 98 F (36.7 C), temperature source Oral, resp. rate 18, height 5\' 6"  (1.676 m), weight 151 lb 7.3 oz (68.7 kg), SpO2 100.00%.  Filed Weights   02/12/14 0103 02/13/14 0012 02/14/14 0441  Weight: 149 lb 14.6 oz (68 kg) 151 lb 7.3 oz (68.7 kg) 151 lb  7.3 oz (68.7 kg)    Labs  CBC  Recent Labs  02/13/14 0321 02/14/14 0303  WBC 7.3 6.3  HGB 11.8* 11.2*  HCT 35.5* 33.0*  MCV 89.6 91.7  PLT 233 188   Basic Metabolic Panel  Recent Labs  02/12/14 0215 02/12/14 1129 02/14/14 0303  NA 144  --  141  K 3.2* 4.2 4.0  CL 107  --  107  CO2 23  --  21  GLUCOSE 129*  --  107*  BUN 11  --  13  CREATININE 0.76 0.76 0.86  CALCIUM 9.5  --  9.2  MG 2.2  --   --    Liver Function Tests  Recent Labs  02/12/14 0215  AST 29  ALT 16  ALKPHOS 131*  BILITOT 0.4  PROT 6.9  ALBUMIN 3.9   Cardiac Enzymes  Recent Labs  02/12/14 0215 02/12/14 1129 02/14/14 0303  TROPONINI 5.94* 2.61* 0.55*   Hemoglobin A1C  Recent Labs  02/12/14 0215  HGBA1C 5.4   Fasting Lipid Panel  Recent Labs  02/12/14 0215  CHOL 168  HDL 64  LDLCALC 88  TRIG 82  CHOLHDL 2.6   Thyroid Function Tests  Recent Labs  02/12/14 0215  TSH 1.130    Disposition  Pt is being discharged home today in good condition.  Follow-up Plans & Appointments      Follow-up Information   Follow up with Jory Sims, NP On 03/18/2014. (1:10pm)    Specialty:  Nurse Practitioner   Contact information:   [place] [address] [phone]       Discharge Medications    Medication List         aspirin 81 MG EC tablet  Take 1 tablet (81 mg total) by mouth daily.     cholecalciferol 1000 UNITS tablet  Commonly known as:  VITAMIN D  Take 1,000 Units by mouth daily.  CRANBERRY PO  Take 2 capsules by mouth daily.     esomeprazole 40 MG capsule  Commonly known as:  NEXIUM  Take 40 mg by mouth daily at 12 noon.     fexofenadine-pseudoephedrine 180-240 MG per 24 hr tablet  Commonly known as:  ALLEGRA-D 24  Take 1 tablet by mouth daily as needed (for allergies).     GLUCOSAMINE-MSM DS PO  Take 2 tablets by mouth daily.     losartan 25 MG tablet  Commonly known as:  COZAAR  Take 25 mg by mouth daily.      metoprolol tartrate 25 MG tablet  Commonly known as:  LOPRESSOR  Take 1 tablet (25 mg total) by mouth 2 (two) times daily.     multivitamins ther. w/minerals Tabs tablet  Take 1 tablet by mouth daily.     ranitidine 300 MG tablet  Commonly known as:  ZANTAC  Take 300 mg by mouth at bedtime.         Duration of Discharge Encounter   Greater than 30 minutes including physician time.  Hilbert Corrigan PA-C Pager: [phone] 02/14/2014, 10:36 AM

## 2014-02-14 NOTE — Progress Notes (Signed)
Patient Name: Nancy Winters Date of Encounter: 02/14/2014     Principal Problem:   NSTEMI (non-ST elevated myocardial infarction) Active Problems:   Takotsubo syndrome, EF 35% on cath   S/P cardiac cath, non obstructive CAD   NICM (nonischemic cardiomyopathy) EF 35%    SUBJECTIVE  No CP or SOB overnight. No palpitation or dizziness. Feeling well.  CURRENT MEDS . aspirin EC  81 mg Oral Daily  . atorvastatin  80 mg Oral q1800  . cholecalciferol  1,000 Units Oral Daily  . heparin  5,000 Units Subcutaneous 3 times per day  . losartan  25 mg Oral Daily  . metoprolol tartrate  25 mg Oral BID  . multivitamin with minerals  1 tablet Oral Daily  . pantoprazole  80 mg Oral Q1200    OBJECTIVE  Filed Vitals:   02/13/14 2016 02/13/14 2350 02/14/14 0441 02/14/14 0815  BP: 126/59 138/75 113/52 113/59  Pulse: 79   69  Temp: 98 F (36.7 C) 98 F (36.7 C) 98 F (36.7 C) 98 F (36.7 C)  TempSrc: Oral Oral Oral Oral  Resp: 18   18  Height:      Weight:   151 lb 7.3 oz (68.7 kg)   SpO2: 100%   100%   No intake or output data in the 24 hours ending 02/14/14 0826 Filed Weights   02/12/14 0103 02/13/14 0012 02/14/14 0441  Weight: 149 lb 14.6 oz (68 kg) 151 lb 7.3 oz (68.7 kg) 151 lb 7.3 oz (68.7 kg)    PHYSICAL EXAM  General: Pleasant, NAD. Neuro: Alert and oriented X 3. Moves all extremities spontaneously. Psych: Normal affect. HEENT:  Normal  Neck: Supple without bruits or JVD. Lungs:  Resp regular and unlabored, CTA. Heart: RRR no s3, s4, or murmurs. Abdomen: Soft, non-tender, non-distended, BS + x 4.  Extremities: No clubbing, cyanosis or edema. DP/PT/Radials 2+ and equal bilaterally.  Accessory Clinical Findings  CBC  Recent Labs  02/13/14 0321 02/14/14 0303  WBC 7.3 6.3  HGB 11.8* 11.2*  HCT 35.5* 33.0*  MCV 89.6 91.7  PLT 233 211   Basic Metabolic Panel  Recent Labs  02/12/14 0215 02/12/14 1129 02/14/14 0303  NA 144  --  141  K 3.2* 4.2 4.0    CL 107  --  107  CO2 23  --  21  GLUCOSE 129*  --  107*  BUN 11  --  13  CREATININE 0.76 0.76 0.86  CALCIUM 9.5  --  9.2  MG 2.2  --   --    Liver Function Tests  Recent Labs  02/12/14 0215  AST 29  ALT 16  ALKPHOS 131*  BILITOT 0.4  PROT 6.9  ALBUMIN 3.9   Cardiac Enzymes  Recent Labs  02/12/14 0215 02/12/14 1129 02/14/14 0303  TROPONINI 5.94* 2.61* 0.55*   Hemoglobin A1C  Recent Labs  02/12/14 0215  HGBA1C 5.4   Fasting Lipid Panel  Recent Labs  02/12/14 0215  CHOL 168  HDL 64  LDLCALC 88  TRIG 82  CHOLHDL 2.6   Thyroid Function Tests  Recent Labs  02/12/14 0215  TSH 1.130    TELE NSR with HR 70s overnight, short burst of SVT with HR 140s last night. Quickly resolved    ECG  NSR with HR 80s, poor R wave progression in the anterior lead  Echocardiogram  LV EF: 40%  ------------------------------------------------------------------- Indications: MI - acute 410.91.  ------------------------------------------------------------------- History: PMH: Takotsubo syndrome. Non-ischemic cardiomyopathy.  -------------------------------------------------------------------  Study Conclusions  - Left ventricle: The cavity size was normal. Wall thickness was normal. The estimated ejection fraction was 40%. Severe hypokinesis to akinesis of the true apex and all the peri-apical segments. Other wall segments appear hyperkinetic. Doppler parameters are consistent with abnormal left ventricular relaxation (grade 1 diastolic dysfunction). - Aortic valve: There was no stenosis. - Mitral valve: There was no significant regurgitation. - Right ventricle: The cavity size was normal. Systolic function was normal. - Tricuspid valve: Peak RV-RA gradient (S): 17 mm Hg. - Pulmonary arteries: PA peak pressure: 20 mm Hg (S). - Inferior vena cava: The vessel was normal in size. The respirophasic diameter changes were in the normal range (>= 50%), consistent  with normal central venous pressure. - Pericardium, extracardiac: A trivial pericardial effusion was identified.  Impressions:  - Normal LV size with EF 40%. Severe hypokinesis to akinesis of the apex and all the peri-apical segments with hyperkinesis of the remaining segments. This is consistent with a stress (Takotsubo-type) cardiomyopathy. Normal RV size and systolic function. No significant valvular abnormalities.       Radiology/Studies  No results found.  ASSESSMENT AND PLAN  1. Demand ischemia 2/2 takotsubo cardiomyopathy  - cath 02/12/2014 EF 35%, 20% ostial LAD. Medical therapy for Takotsubo cardiomyopathy  - echo 02/13/2014 EF 40%, severe hypokinesis to akinesis of apex, grade 1 diatolic HF, trivial pericardial effusion  - continue losartan and metoprolol. May consider change metoprolol to carvedilol for increase benefit for CHF vs staying on metoprolol to prevent recurrence of supraventricular tachycardia (short burst last night, no recurrence)  - discharge today  2. Hypokalemia   3. NSVT  4. HTN  Signed, Almyra Deforest PA-C Pager: [phone]  Patient seen, examined. Available data reviewed. Agree with findings, assessment, and plan as outlined by Almyra Deforest, PA-C. Exam reveals alert, oriented woman in NAD. Lungs CTA, heart RRR without murmur or gallop. Extremities without edema. Chart reviewed in detail. Pt with acute Takotsubo's cardiomyopathy. Expected course reviewed with patient and family. Continue metoprolol. Follow-up with Dr Domenic Polite in [place]. Should have a follow-up echo in 4-6 weeks at discretion of Dr Domenic Polite. No driving x 1 week.  Sherren Mocha, M.D. 02/14/2014 9:58 AM

## 2014-02-18 ENCOUNTER — Encounter: Payer: Self-pay | Admitting: Cardiovascular Disease

## 2014-03-15 NOTE — Progress Notes (Signed)
HPI: Nancy Winters is a 68 year old female patient of Dr. Sherryle Lis and we are following. Status post hospitalization after undergoing cardiac catheterization in the setting of reduced ejection fraction 40%, chest pain, with history of hypertension, and GERD. The patient was admitted for non-ST elevation MI and underwent cardiac catheterization on 02/12/2014.  The patient's LAD was a moderate size vessel that is smooth to 20% ostial narrowing. The left circumflex the right coronary artery the left main artery were angiographically normal. Ejection fraction was 35%. This is consistent with a possible cardiomyopathy. The ventriculogram was highly suggestive of Takotsubo cardiomyopathy. The patient had an episode of PSVT . It did not recur . Post procedure. She was placed on metoprolol and continued on losartan. I discharge her blood pressure was 113/59 with a pulse of 69.   She is without complaint today. She has had no recurrent chest pain, tolerating medications without side effects. Is interested in cardiac rehab, but lives in [place] and is going to be established with cardiac rehab there.   Allergies  Allergen Reactions  . Ciprofloxacin     myalgia    Current Outpatient Prescriptions  Medication Sig Dispense Refill  . aspirin EC 81 MG EC tablet Take 1 tablet (81 mg total) by mouth daily.      . cholecalciferol (VITAMIN D) 1000 UNITS tablet Take 1,000 Units by mouth daily.      Marland Kitchen CRANBERRY PO Take 2 capsules by mouth daily.      Marland Kitchen esomeprazole (NEXIUM) 40 MG capsule Take 40 mg by mouth daily at 12 noon.      . fexofenadine-pseudoephedrine (ALLEGRA-D 24) 180-240 MG per 24 hr tablet Take 1 tablet by mouth daily as needed (for allergies).      . Glucosamine Sulfate-MSM (GLUCOSAMINE-MSM DS PO) Take 2 tablets by mouth daily.      Marland Kitchen losartan (COZAAR) 25 MG tablet Take 1 tablet (25 mg total) by mouth daily.  90 tablet  3  . metoprolol tartrate (LOPRESSOR) 25 MG tablet Take 1 tablet (25 mg total)  by mouth 2 (two) times daily.  60 tablet  11  . Multiple Vitamins-Minerals (MULTIVITAMINS THER. W/MINERALS) TABS tablet Take 1 tablet by mouth daily.      . ranitidine (ZANTAC) 300 MG tablet Take 300 mg by mouth at bedtime.       No current facility-administered medications for this visit.    Past Medical History  Diagnosis Date  . GERD (gastroesophageal reflux disease)   . Hypertension   . Shortness of breath     for about a year  . Female bladder prolapse   . Takotsubo syndrome 02/13/2014    a. cath 8/25 EF 35%, 20% ost LAD, otherwise clean coronary  b. Echo 02/13/2014 EF 40%    Past Surgical History  Procedure Laterality Date  . Tonsillectomy    . Tubal ligation    . Breast surgery Left 2011    mild ducts removed  . Breast cyst excision Left 1979    ROS: Review of systems complete and found to be negative unless listed above  PHYSICAL EXAM BP 128/70  Pulse 64  Ht 5\' 6"  (1.676 m)  Wt 152 lb (68.947 kg)  BMI 24.55 kg/m2 General: Well developed, well nourished, in no acute distress Head: Eyes PERRLA, No xanthomas.   Normal cephalic and atramatic  Lungs: Clear bilaterally to auscultation and percussion. Heart: HRRR S1 S2, without MRG.  Pulses are 2+ & equal.  No carotid bruit. No JVD.  No abdominal bruits. No femoral bruits. Abdomen: Bowel sounds are positive, abdomen soft and non-tender without masses or                  Hernia's noted. Msk:  Back normal, normal gait. Normal strength and tone for age. Extremities: No clubbing, cyanosis or edema.  DP +1 Neuro: Alert and oriented X 3. Psych:  Good affect, responds appropriately   ASSESSMENT AND PLAN

## 2014-03-18 ENCOUNTER — Ambulatory Visit (INDEPENDENT_AMBULATORY_CARE_PROVIDER_SITE_OTHER): Payer: Medicare Other | Admitting: Adult Health

## 2014-03-18 ENCOUNTER — Encounter: Payer: Self-pay | Admitting: Adult Health

## 2014-03-18 VITALS — BP 128/70 | HR 64 | Ht 66.0 in | Wt 152.0 lb

## 2014-03-18 DIAGNOSIS — I428 Other cardiomyopathies: Secondary | ICD-10-CM | POA: Diagnosis not present

## 2014-03-18 DIAGNOSIS — I248 Other forms of acute ischemic heart disease: Secondary | ICD-10-CM

## 2014-03-18 MED ORDER — METOPROLOL TARTRATE 25 MG PO TABS: 25.0000 mg | ORAL_TABLET | Freq: Two times a day (BID) | ORAL | Status: DC

## 2014-03-18 MED ORDER — LOSARTAN POTASSIUM 25 MG PO TABS: 25.0000 mg | ORAL_TABLET | Freq: Every day | ORAL | Status: DC

## 2014-03-18 NOTE — Assessment & Plan Note (Addendum)
No recurrent symptoms of chest pain. She is tolerating metoprolol very well without fatigue. I have counseled her on use of Allegera D. Pseudophedrine component may cause some chest pain or palpitations. She is is to use this sparingly for allergy symptoms. She verbalizes understanding. She will follow up with Dr. Domenic Polite in the [place] office in 3 months as she lives in [place] for convenience of travel. However, she is always welcome to come to [facility] as well.   Repeat echo at the discretion of Dr Domenic Polite on follow up.

## 2014-03-18 NOTE — Patient Instructions (Signed)
Your physician recommends that you schedule a follow-up appointment in: 3 months   Your physician recommends that you continue on your current medications as directed. Please refer to the Current Medication list given to you today.      I refilled your medications with Meds by Mail 90 day supply      Thank you for choosing [facility] !

## 2014-03-18 NOTE — Assessment & Plan Note (Signed)
No complaints of fluid overload or dyspnea. Continue current medications.

## 2014-03-18 NOTE — Progress Notes (Deleted)
Name: Nancy Winters    DOB: 1945-09-07  Age: 68 y.o.  MR#: [id number]       PCP:  Delphina Cahill, MD      Insurance: Payor: MEDICARE / Plan: MEDICARE PART A AND B / Product Type: *No Product type* /   CC:    Chief Complaint  Patient presents with  . Cardiomyopathy    Takotsubo    VS Filed Vitals:   03/18/14 1302  BP: 128/70  Pulse: 64  Height: 5\' 6"  (1.676 m)  Weight: 152 lb (68.947 kg)    Weights Current Weight  03/18/14 152 lb (68.947 kg)  02/14/14 151 lb 7.3 oz (68.7 kg)  02/14/14 151 lb 7.3 oz (68.7 kg)    Blood Pressure  BP Readings from Last 3 Encounters:  03/18/14 128/70  02/14/14 113/59  02/14/14 113/59     Admit date:  (Not on file) Last encounter with RMR:  Visit date not found   Allergy Ciprofloxacin  Current Outpatient Prescriptions  Medication Sig Dispense Refill  . aspirin EC 81 MG EC tablet Take 1 tablet (81 mg total) by mouth daily.      . cholecalciferol (VITAMIN D) 1000 UNITS tablet Take 1,000 Units by mouth daily.      Marland Kitchen CRANBERRY PO Take 2 capsules by mouth daily.      Marland Kitchen esomeprazole (NEXIUM) 40 MG capsule Take 40 mg by mouth daily at 12 noon.      . fexofenadine-pseudoephedrine (ALLEGRA-D 24) 180-240 MG per 24 hr tablet Take 1 tablet by mouth daily as needed (for allergies).      . Glucosamine Sulfate-MSM (GLUCOSAMINE-MSM DS PO) Take 2 tablets by mouth daily.      Marland Kitchen losartan (COZAAR) 25 MG tablet Take 25 mg by mouth daily.      . metoprolol tartrate (LOPRESSOR) 25 MG tablet Take 1 tablet (25 mg total) by mouth 2 (two) times daily.  60 tablet  11  . Multiple Vitamins-Minerals (MULTIVITAMINS THER. W/MINERALS) TABS tablet Take 1 tablet by mouth daily.      . ranitidine (ZANTAC) 300 MG tablet Take 300 mg by mouth at bedtime.       No current facility-administered medications for this visit.    Discontinued Meds:   There are no discontinued medications.  Patient Active Problem List   Diagnosis Date Noted  . Takotsubo syndrome, EF 35% on cath  02/13/2014  . S/P cardiac cath, non obstructive CAD 02/13/2014  . NICM (nonischemic cardiomyopathy) EF 35% 02/13/2014  . Demand ischemia 2/2 Takotsubo 02/12/2014  . GERD (gastroesophageal reflux disease) 05/14/2013  . Precordial pain 05/14/2013  . History of TIA (transient ischemic attack) 05/14/2013    LABS    Component Value Date/Time   NA 141 02/14/2014 0303   NA 144 02/12/2014 0215   NA 142 04/24/2013 0910   K 4.0 02/14/2014 0303   K 4.2 02/12/2014 1129   K 3.2* 02/12/2014 0215   CL 107 02/14/2014 0303   CL 107 02/12/2014 0215   CL 106 04/24/2013 0910   CO2 21 02/14/2014 0303   CO2 23 02/12/2014 0215   CO2 25 04/24/2013 0910   GLUCOSE 107* 02/14/2014 0303   GLUCOSE 129* 02/12/2014 0215   GLUCOSE 98 04/24/2013 0910   BUN 13 02/14/2014 0303   BUN 11 02/12/2014 0215   BUN 18 04/24/2013 0910   CREATININE 0.86 02/14/2014 0303   CREATININE 0.76 02/12/2014 1129   CREATININE 0.76 02/12/2014 0215   CALCIUM 9.2 02/14/2014 0303  CALCIUM 9.5 02/12/2014 0215   CALCIUM 10.1 04/24/2013 0910   GFRNONAA 68* 02/14/2014 0303   GFRNONAA 85* 02/12/2014 1129   GFRNONAA 85* 02/12/2014 0215   GFRAA 79* 02/14/2014 0303   GFRAA >90 02/12/2014 1129   GFRAA >90 02/12/2014 0215   CMP     Component Value Date/Time   NA 141 02/14/2014 0303   K 4.0 02/14/2014 0303   CL 107 02/14/2014 0303   CO2 21 02/14/2014 0303   GLUCOSE 107* 02/14/2014 0303   BUN 13 02/14/2014 0303   CREATININE 0.86 02/14/2014 0303   CALCIUM 9.2 02/14/2014 0303   PROT 6.9 02/12/2014 0215   ALBUMIN 3.9 02/12/2014 0215   AST 29 02/12/2014 0215   ALT 16 02/12/2014 0215   ALKPHOS 131* 02/12/2014 0215   BILITOT 0.4 02/12/2014 0215   GFRNONAA 68* 02/14/2014 0303   GFRAA 79* 02/14/2014 0303       Component Value Date/Time   WBC 6.3 02/14/2014 0303   WBC 7.3 02/13/2014 0321   WBC 7.3 02/12/2014 1129   HGB 11.2* 02/14/2014 0303   HGB 11.8* 02/13/2014 0321   HGB 11.4* 02/12/2014 1129   HCT 33.0* 02/14/2014 0303   HCT 35.5* 02/13/2014 0321   HCT 33.1* 02/12/2014  1129   MCV 91.7 02/14/2014 0303   MCV 89.6 02/13/2014 0321   MCV 89.7 02/12/2014 1129    Lipid Panel     Component Value Date/Time   CHOL 168 02/12/2014 0215   TRIG 82 02/12/2014 0215   HDL 64 02/12/2014 0215   CHOLHDL 2.6 02/12/2014 0215   VLDL 16 02/12/2014 0215   LDLCALC 88 02/12/2014 0215    ABG No results found for this basename: phart, pco2, pco2art, po2, po2art, hco3, tco2, acidbasedef, o2sat     Lab Results  Component Value Date   TSH 1.130 02/12/2014   BNP (last 3 results)  Recent Labs  02/12/14 0215 02/14/14 0303  PROBNP 938.3* 2777.0*   Cardiac Panel (last 3 results) No results found for this basename: CKTOTAL, CKMB, TROPONINI, RELINDX,  in the last 72 hours  Iron/TIBC/Ferritin/ %Sat No results found for this basename: iron, tibc, ferritin, ironpctsat     EKG Orders placed during the hospital encounter of 02/11/14  . EKG 12-LEAD  . EKG 12-LEAD  . EKG 12-LEAD  . EKG 12-LEAD  . EKG 12-LEAD  . EKG 12-LEAD  . EKG 12-LEAD  . EKG 12-LEAD  . EKG 12-LEAD  . EKG 12-LEAD  . EKG 12-LEAD  . EKG 12-LEAD  . EKG 12-LEAD     Prior Assessment and Plan Problem List as of 03/18/2014     Cardiovascular and Mediastinum   Demand ischemia 2/2 Takotsubo   Takotsubo syndrome, EF 35% on cath   NICM (nonischemic cardiomyopathy) EF 35%     Digestive   GERD (gastroesophageal reflux disease)   Last Assessment & Plan   05/14/2013 Office Visit Written 05/14/2013  2:44 PM by [name], MD     Continues on antacids.      Other   Precordial pain   Last Assessment & Plan   05/14/2013 Office Visit Written 05/14/2013  2:42 PM by [name], MD     Resolved, although still feels intermittent shortness of breath. Recent Cardiolite indicates no ischemia. We will go ahead and obtain an echocardiogram to better assess cardiac structure and function , if reassuring doubt that we need to pursue further cardiac testing unless her symptoms progress.    History of TIA  (  transient ischemic attack)   Last Assessment & Plan   05/14/2013 Office Visit Written 05/14/2013  2:43 PM by [name], MD     Questionable, reports brief period, when her speech was "off" several weeks ago. She does not have any carotid bruits. We will obtain carotid Dopplers to exclude significant stenosis. Otherwise continue on aspirin.    S/P cardiac cath, non obstructive CAD       Imaging: No results found.

## 2014-03-19 ENCOUNTER — Encounter: Payer: Self-pay | Admitting: Cardiology

## 2014-03-29 DIAGNOSIS — N390 Urinary tract infection, site not specified: Secondary | ICD-10-CM | POA: Diagnosis not present

## 2014-04-02 DIAGNOSIS — I10 Essential (primary) hypertension: Secondary | ICD-10-CM | POA: Diagnosis not present

## 2014-04-02 DIAGNOSIS — I252 Old myocardial infarction: Secondary | ICD-10-CM | POA: Diagnosis not present

## 2014-04-02 DIAGNOSIS — I5181 Takotsubo syndrome: Secondary | ICD-10-CM | POA: Diagnosis not present

## 2014-04-02 DIAGNOSIS — I509 Heart failure, unspecified: Secondary | ICD-10-CM | POA: Diagnosis not present

## 2014-04-04 DIAGNOSIS — I5181 Takotsubo syndrome: Secondary | ICD-10-CM | POA: Diagnosis not present

## 2014-04-04 DIAGNOSIS — I509 Heart failure, unspecified: Secondary | ICD-10-CM | POA: Diagnosis not present

## 2014-04-04 DIAGNOSIS — I252 Old myocardial infarction: Secondary | ICD-10-CM | POA: Diagnosis not present

## 2014-04-04 DIAGNOSIS — I10 Essential (primary) hypertension: Secondary | ICD-10-CM | POA: Diagnosis not present

## 2014-04-09 DIAGNOSIS — I509 Heart failure, unspecified: Secondary | ICD-10-CM | POA: Diagnosis not present

## 2014-04-09 DIAGNOSIS — I252 Old myocardial infarction: Secondary | ICD-10-CM | POA: Diagnosis not present

## 2014-04-09 DIAGNOSIS — I5181 Takotsubo syndrome: Secondary | ICD-10-CM | POA: Diagnosis not present

## 2014-04-09 DIAGNOSIS — I10 Essential (primary) hypertension: Secondary | ICD-10-CM | POA: Diagnosis not present

## 2014-04-11 ENCOUNTER — Telehealth: Payer: Self-pay | Admitting: *Deleted

## 2014-04-11 ENCOUNTER — Other Ambulatory Visit (HOSPITAL_COMMUNITY): Payer: Self-pay | Admitting: Physician Assistant

## 2014-04-11 DIAGNOSIS — I252 Old myocardial infarction: Secondary | ICD-10-CM | POA: Diagnosis not present

## 2014-04-11 DIAGNOSIS — I509 Heart failure, unspecified: Secondary | ICD-10-CM | POA: Diagnosis not present

## 2014-04-11 DIAGNOSIS — I10 Essential (primary) hypertension: Secondary | ICD-10-CM | POA: Diagnosis not present

## 2014-04-11 DIAGNOSIS — I5181 Takotsubo syndrome: Secondary | ICD-10-CM | POA: Diagnosis not present

## 2014-04-11 MED ORDER — METOPROLOL TARTRATE 25 MG PO TABS: 25.0000 mg | ORAL_TABLET | Freq: Two times a day (BID) | ORAL | Status: DC

## 2014-04-11 NOTE — Telephone Encounter (Signed)
PT NEEDS METOPROLOL CALLED IN AGAIN THE PHARMACY STATES THEY NEVER RECEIVED REQUEST

## 2014-04-12 ENCOUNTER — Other Ambulatory Visit: Payer: Self-pay

## 2014-04-12 NOTE — Telephone Encounter (Signed)
Please review for refill.  

## 2014-04-13 NOTE — Telephone Encounter (Signed)
Refilled on 10/22

## 2014-04-16 DIAGNOSIS — I252 Old myocardial infarction: Secondary | ICD-10-CM | POA: Diagnosis not present

## 2014-04-16 DIAGNOSIS — I5181 Takotsubo syndrome: Secondary | ICD-10-CM | POA: Diagnosis not present

## 2014-04-16 DIAGNOSIS — I509 Heart failure, unspecified: Secondary | ICD-10-CM | POA: Diagnosis not present

## 2014-04-16 DIAGNOSIS — I10 Essential (primary) hypertension: Secondary | ICD-10-CM | POA: Diagnosis not present

## 2014-04-17 ENCOUNTER — Emergency Department (HOSPITAL_COMMUNITY): Payer: Medicare Other

## 2014-04-17 ENCOUNTER — Emergency Department (HOSPITAL_COMMUNITY)
Admission: EM | Admit: 2014-04-17 | Discharge: 2014-04-18 | Disposition: A | Discharge status: Home / Self Care | Payer: Medicare Other | Attending: Emergency Medicine | Admitting: Emergency Medicine

## 2014-04-17 ENCOUNTER — Encounter (HOSPITAL_COMMUNITY): Payer: Self-pay | Admitting: Emergency Medicine

## 2014-04-17 DIAGNOSIS — J8410 Pulmonary fibrosis, unspecified: Secondary | ICD-10-CM | POA: Diagnosis not present

## 2014-04-17 DIAGNOSIS — I252 Old myocardial infarction: Secondary | ICD-10-CM | POA: Diagnosis not present

## 2014-04-17 DIAGNOSIS — Z87448 Personal history of other diseases of urinary system: Secondary | ICD-10-CM | POA: Insufficient documentation

## 2014-04-17 DIAGNOSIS — K219 Gastro-esophageal reflux disease without esophagitis: Secondary | ICD-10-CM | POA: Diagnosis not present

## 2014-04-17 DIAGNOSIS — R0789 Other chest pain: Secondary | ICD-10-CM | POA: Diagnosis not present

## 2014-04-17 DIAGNOSIS — Z7982 Long term (current) use of aspirin: Secondary | ICD-10-CM | POA: Insufficient documentation

## 2014-04-17 DIAGNOSIS — Z79899 Other long term (current) drug therapy: Secondary | ICD-10-CM | POA: Insufficient documentation

## 2014-04-17 DIAGNOSIS — R079 Chest pain, unspecified: Secondary | ICD-10-CM

## 2014-04-17 DIAGNOSIS — I10 Essential (primary) hypertension: Secondary | ICD-10-CM | POA: Diagnosis not present

## 2014-04-17 DIAGNOSIS — I2510 Atherosclerotic heart disease of native coronary artery without angina pectoris: Secondary | ICD-10-CM | POA: Diagnosis not present

## 2014-04-17 DIAGNOSIS — I509 Heart failure, unspecified: Secondary | ICD-10-CM | POA: Diagnosis not present

## 2014-04-17 DIAGNOSIS — I5181 Takotsubo syndrome: Secondary | ICD-10-CM | POA: Diagnosis not present

## 2014-04-17 LAB — CBC WITH DIFFERENTIAL/PLATELET
BASOS PCT: 0 % (ref 0–1)
Basophils Absolute: 0 10*3/uL (ref 0.0–0.1)
Eosinophils Absolute: 0.2 10*3/uL (ref 0.0–0.7)
Eosinophils Relative: 4 % (ref 0–5)
HCT: 35.9 % — ABNORMAL LOW (ref 36.0–46.0)
HEMOGLOBIN: 12.3 g/dL (ref 12.0–15.0)
LYMPHS ABS: 3.2 10*3/uL (ref 0.7–4.0)
Lymphocytes Relative: 45 % (ref 12–46)
MCH: 30.9 pg (ref 26.0–34.0)
MCHC: 34.3 g/dL (ref 30.0–36.0)
MCV: 90.2 fL (ref 78.0–100.0)
MONO ABS: 0.7 10*3/uL (ref 0.1–1.0)
MONOS PCT: 10 % (ref 3–12)
NEUTROS ABS: 2.8 10*3/uL (ref 1.7–7.7)
NEUTROS PCT: 41 % — AB (ref 43–77)
Platelets: 283 10*3/uL (ref 150–400)
RBC: 3.98 MIL/uL (ref 3.87–5.11)
RDW: 13.3 % (ref 11.5–15.5)
WBC: 6.9 10*3/uL (ref 4.0–10.5)

## 2014-04-17 LAB — COMPREHENSIVE METABOLIC PANEL
ALBUMIN: 4.1 g/dL (ref 3.5–5.2)
ALK PHOS: 120 U/L — AB (ref 39–117)
ALT: 18 U/L (ref 0–35)
AST: 19 U/L (ref 0–37)
Anion gap: 14 (ref 5–15)
BUN: 17 mg/dL (ref 6–23)
CHLORIDE: 101 meq/L (ref 96–112)
CO2: 23 meq/L (ref 19–32)
Calcium: 9.8 mg/dL (ref 8.4–10.5)
Creatinine, Ser: 1.13 mg/dL — ABNORMAL HIGH (ref 0.50–1.10)
GFR, EST AFRICAN AMERICAN: 57 mL/min — AB (ref >90)
GFR, EST NON AFRICAN AMERICAN: 49 mL/min — AB (ref >90)
GLUCOSE: 98 mg/dL (ref 70–99)
POTASSIUM: 3.9 meq/L (ref 3.7–5.3)
Sodium: 138 mEq/L (ref 137–147)
Total Bilirubin: 0.3 mg/dL (ref 0.3–1.2)
Total Protein: 7.4 g/dL (ref 6.0–8.3)

## 2014-04-17 LAB — TROPONIN I

## 2014-04-17 MED ORDER — ASPIRIN 81 MG PO CHEW
324.0000 mg | CHEWABLE_TABLET | Freq: Once | ORAL | Status: AC
  Administered 2014-04-18: 324 mg via ORAL
  Filled 2014-04-17: qty 4

## 2014-04-17 MED ORDER — NITROGLYCERIN 0.4 MG/SPRAY TL SOLN: 1.0000 | Freq: Once | Status: DC

## 2014-04-17 NOTE — ED Provider Notes (Signed)
CSN: [id number]     Arrival date & time 04/17/14  2233 History  This chart was scribed for Julianne Rice, MD by Delphia Grates, ED Scribe. This patient was seen in room APA01/APA01 and the patient's care was started at 11:28 PM.   Chief Complaint  Patient presents with  . Chest Pain    The history is provided by the patient. No language interpreter was used.    HPI Comments: Nancy Winters is a 68 y.o. female who presents to the Emergency Department complaining of sharp chest pain that began 3 hours ago. She notes associated constant, non-radiating chest pressure onset shortly after the sharp chest pain resolved. Patient rates her pressure at 1/10 currently.She reports eating 2 hours before onset of symptoms. Patient also reports feeling tired after physical therapy earlier today. Patient has recent history of MI on August 24th, 2015 and reports the current pressure feels similar but less intense. She denies stent placement. Patient currently takes baby aspirin. . She denies SOB, nausea, and vomiting.    Cardiologist: Dr. Domenic Polite  Past Medical History  Diagnosis Date  . GERD (gastroesophageal reflux disease)   . Essential hypertension, benign   . Female bladder prolapse   . Takotsubo syndrome 02/13/2014    Echo 02/13/2014 LVEF 40%  . Coronary atherosclerosis     Mild, nonobstructive August 2015   Past Surgical History  Procedure Laterality Date  . Tonsillectomy    . Tubal ligation    . Breast surgery Left 2011    Ducts removed  . Breast cyst excision Left 1979   History reviewed. No pertinent family history. History  Substance Use Topics  . Smoking status: Never Smoker   . Smokeless tobacco: Not on file  . Alcohol Use: No   OB History   Grav Para Term Preterm Abortions TAB SAB Ect Mult Living                 Review of Systems  Constitutional: Negative for fever and chills.  Respiratory: Negative for cough and shortness of breath.   Cardiovascular: Positive for  chest pain. Negative for palpitations and leg swelling.  Gastrointestinal: Negative for nausea, vomiting, abdominal pain, diarrhea and constipation.  Genitourinary: Negative for dysuria.  Musculoskeletal: Negative for back pain, myalgias, neck pain and neck stiffness.  Skin: Negative for rash and wound.  Neurological: Negative for dizziness, syncope, weakness and numbness.  All other systems reviewed and are negative.     Allergies  Ciprofloxacin  Home Medications   Prior to Admission medications   Medication Sig Start Date End Date Taking? Authorizing Provider  aspirin EC 81 MG EC tablet Take 1 tablet (81 mg total) by mouth daily. 02/14/14  Yes Almyra Deforest, PA  cholecalciferol (VITAMIN D) 1000 UNITS tablet Take 1,000 Units by mouth daily.   Yes Historical Provider, MD  CRANBERRY PO Take 2 capsules by mouth daily.   Yes Historical Provider, MD  esomeprazole (NEXIUM) 40 MG capsule Take 40 mg by mouth daily at 12 noon.   Yes Historical Provider, MD  Glucosamine Sulfate-MSM (GLUCOSAMINE-MSM DS PO) Take 2 tablets by mouth daily.   Yes Historical Provider, MD  loratadine (CLARITIN) 10 MG tablet Take 10 mg by mouth daily.   Yes Historical Provider, MD  losartan (COZAAR) 25 MG tablet Take 1 tablet (25 mg total) by mouth daily. 03/18/14  Yes Lendon Colonel, NP  metoprolol tartrate (LOPRESSOR) 25 MG tablet Take 1 tablet (25 mg total) by mouth 2 (two) times daily.  04/11/14  Yes Lendon Colonel, NP  Multiple Vitamins-Minerals (MULTIVITAMINS THER. W/MINERALS) TABS tablet Take 1 tablet by mouth daily.   Yes Historical Provider, MD  ranitidine (ZANTAC) 300 MG tablet Take 300 mg by mouth at bedtime.   Yes Historical Provider, MD  fexofenadine-pseudoephedrine (ALLEGRA-D 24) 180-240 MG per 24 hr tablet Take 1 tablet by mouth daily as needed (for allergies).    Historical Provider, MD   Triage Vitals: BP 141/64  Pulse 58  Temp(Src) 98 F (36.7 C) (Oral)  Resp 20  Ht 5\' 6"  (1.676 m)  Wt 150 lb  (68.04 kg)  BMI 24.22 kg/m2  SpO2 100%  Physical Exam  Nursing note and vitals reviewed. Constitutional: She is oriented to person, place, and time. She appears well-developed and well-nourished. No distress.  HENT:  Head: Normocephalic and atraumatic.  Mouth/Throat: Oropharynx is clear and moist.  Eyes: EOM are normal. Pupils are equal, round, and reactive to light.  Neck: Normal range of motion. Neck supple.  Cardiovascular: Normal rate and regular rhythm.   Pulmonary/Chest: Effort normal and breath sounds normal. No respiratory distress. She has no wheezes. She has no rales. She exhibits no tenderness.  Abdominal: Soft. Bowel sounds are normal. She exhibits no distension and no mass. There is no tenderness. There is no rebound and no guarding.  Musculoskeletal: Normal range of motion. She exhibits no edema and no tenderness.  No calf swelling or tenderness  Neurological: She is alert and oriented to person, place, and time.  Skin: Skin is warm and dry. No rash noted. No erythema.  Psychiatric: She has a normal mood and affect. Her behavior is normal.    ED Course  Procedures (including critical care time)  DIAGNOSTIC STUDIES: Oxygen Saturation is 100% on room air, normal by my interpretation.    COORDINATION OF CARE: At 2334 Discussed treatment plan with patient which includes aspirin and nitroglycerin. Patient agrees.   Labs Review Labs Reviewed  TROPONIN I  CBC WITH DIFFERENTIAL  COMPREHENSIVE METABOLIC PANEL    Imaging Review No results found.   EKG Interpretation None      Date: 04/18/2014  Rate: 51  Rhythm: sinus bradycardia  QRS Axis: normal  Intervals: normal  ST/T Wave abnormalities: nonspecific T wave changes  Conduction Disutrbances:none  Narrative Interpretation:   Old EKG Reviewed: ST segment elevation is resolved from previous EKG.   MDM   Final diagnoses:  Chest pain    I personally performed the services described in this documentation,  which was scribed in my presence. The recorded information has been reviewed and is accurate.  Patient is chest pain-free in the emergency department. EKG with no concerning findings. Initial troponin is normal. Will discuss with cardiology.  Patient remains chest pain-free. Discussed with cardiology fellow. Patient with recent clean coronary cath. Recommends repeat troponin and then discharged home to follow up with cardiologist.  Repeat troponin is normal. Patient remains chest pain free. Advised to follow-up with cardiologist. Return precautions have been given.  Julianne Rice, MD 04/19/14 [phone]

## 2014-04-17 NOTE — ED Notes (Signed)
Chest pain, onset 830p,  Hx of MI 8/24

## 2014-04-18 DIAGNOSIS — R079 Chest pain, unspecified: Secondary | ICD-10-CM | POA: Diagnosis not present

## 2014-04-18 DIAGNOSIS — J8410 Pulmonary fibrosis, unspecified: Secondary | ICD-10-CM | POA: Diagnosis not present

## 2014-04-18 LAB — TROPONIN I

## 2014-04-18 MED ORDER — NITROGLYCERIN 0.4 MG SL SUBL
0.4000 mg | SUBLINGUAL_TABLET | Freq: Once | SUBLINGUAL | Status: DC
  Filled 2014-04-18: qty 1

## 2014-04-18 NOTE — Discharge Instructions (Signed)

## 2014-04-18 NOTE — ED Notes (Signed)
Patient states that she feels a lot better than when she first came in. No distress noted at this time.

## 2014-04-19 ENCOUNTER — Encounter: Payer: Self-pay | Admitting: Cardiology

## 2014-04-19 ENCOUNTER — Ambulatory Visit (INDEPENDENT_AMBULATORY_CARE_PROVIDER_SITE_OTHER): Payer: Medicare Other | Admitting: Cardiology

## 2014-04-19 VITALS — BP 120/78 | HR 79 | Wt 152.0 lb

## 2014-04-19 DIAGNOSIS — I429 Cardiomyopathy, unspecified: Secondary | ICD-10-CM

## 2014-04-19 DIAGNOSIS — I248 Other forms of acute ischemic heart disease: Secondary | ICD-10-CM

## 2014-04-19 DIAGNOSIS — Z23 Encounter for immunization: Secondary | ICD-10-CM | POA: Diagnosis not present

## 2014-04-19 DIAGNOSIS — I5181 Takotsubo syndrome: Secondary | ICD-10-CM

## 2014-04-19 DIAGNOSIS — I2510 Atherosclerotic heart disease of native coronary artery without angina pectoris: Secondary | ICD-10-CM

## 2014-04-19 DIAGNOSIS — I428 Other cardiomyopathies: Secondary | ICD-10-CM

## 2014-04-19 MED ORDER — NITROGLYCERIN 0.4 MG SL SUBL: 0.4000 mg | SUBLINGUAL_TABLET | SUBLINGUAL | Status: DC | PRN

## 2014-04-19 NOTE — Progress Notes (Signed)
Reason for visit: Chest pain, Tako-tsubo syndrome  Clinical Summary [name] is a 68 y.o.female most recently seen by Nancy Winters in late September following hospitalization with demand ischemia secondary to Tako-tsubo syndrome. Cardiac catheterization showed only mild atherosclerosis. Record review finds recent ER visit secondary to chest pain. Troponin I levels were normal. ECG showed no acute ST segment changes. Chest x-ray reported no acute cardiopulmonary findings. She was discharged home.  She tells me today that she feels better. She has not had any further chest pain. She describes feeling of shortness of breath and vague chest discomfort that occurred while she was doing cardiac rehabilitation in [place]. She states that she had just increased her exercise intensity. Symptoms seemed to be more prolonged than usual. Otherwise no change in her medications. She has not yet had a follow-up echocardiogram.  Echocardiogram from August showed LVEF 40% with wall motion abnormalities consistent with Tako-tsubo cardiomyopathy, grade 1 diastolic dysfunction, normal PASP, trivial pericardial effusion.   Allergies  Allergen Reactions  . Ciprofloxacin     myalgia    Current Outpatient Prescriptions  Medication Sig Dispense Refill  . aspirin EC 81 MG EC tablet Take 1 tablet (81 mg total) by mouth daily.      . cholecalciferol (VITAMIN D) 1000 UNITS tablet Take 1,000 Units by mouth daily.      Marland Kitchen CRANBERRY PO Take 2 capsules by mouth daily.      Marland Kitchen esomeprazole (NEXIUM) 40 MG capsule Take 40 mg by mouth daily at 12 noon.      . Glucosamine Sulfate-MSM (GLUCOSAMINE-MSM DS PO) Take 2 tablets by mouth daily.      Marland Kitchen loratadine (CLARITIN) 10 MG tablet Take 10 mg by mouth daily.      Marland Kitchen losartan (COZAAR) 25 MG tablet Take 1 tablet (25 mg total) by mouth daily.  90 tablet  3  . metoprolol tartrate (LOPRESSOR) 25 MG tablet Take 1 tablet (25 mg total) by mouth 2 (two) times daily.  180 tablet  3    . Multiple Vitamins-Minerals (MULTIVITAMINS THER. W/MINERALS) TABS tablet Take 1 tablet by mouth daily.      . ranitidine (ZANTAC) 300 MG tablet Take 300 mg by mouth at bedtime.      . fexofenadine-pseudoephedrine (ALLEGRA-D 24) 180-240 MG per 24 hr tablet Take 1 tablet by mouth daily as needed (for allergies).       No current facility-administered medications for this visit.    Past Medical History  Diagnosis Date  . GERD (gastroesophageal reflux disease)   . Essential hypertension, benign   . Female bladder prolapse   . Takotsubo syndrome 02/13/2014    Echo 02/13/2014 LVEF 40%  . Coronary atherosclerosis     Mild, nonobstructive August 2015    Social History Nancy Winters reports that she has never smoked. She has never used smokeless tobacco. Nancy Winters reports that she does not drink alcohol.  Review of Systems Complete review of systems negative except as otherwise outlined in the clinical summary and also the following. No palpitations, orthopnea or PND.  Physical Examination Filed Vitals:   04/19/14 1257  BP: 120/78  Pulse: 79   Filed Weights   04/19/14 1257  Weight: 152 lb (68.947 kg)   Appears comfortable at rest. HEENT: Conjunctiva and lids normal, oropharynx clear. Neck: Supple, no elevated JVP or carotid bruits, no thyromegaly. Lungs: Clear to auscultation, nonlabored breathing at rest. Cardiac: Regular rate and rhythm, no S3 or significant systolic murmur, no pericardial rub.  Abdomen: Soft, nontender, bowel sounds present. Extremities: No pitting edema, distal pulses 2+. Skin: Warm and dry. Musculoskeletal: No kyphosis. Neuropsychiatric: Alert and oriented x3, affect grossly appropriate.   Problem List and Plan   Takotsubo syndrome Hospitalization in August with findings of only mild coronary atherosclerosis, LVEF was 40% at that time. She has chronic shortness of breath, chest pain symptoms that were limited as outlined above. No evidence of recurrent  ACS or significant ECG change at ER visit. She states that she feels better now. We will obtain an echocardiogram to reassess LVEF, as normalization would be expected. Otherwise return to cardiac rehabilitation, perhaps at lower level. It was see her back of the next 6 weeks.  Coronary atherosclerosis of native coronary artery Mild at heart catheterization in August.    [name], M.D., F.A.C.C.

## 2014-04-19 NOTE — Assessment & Plan Note (Signed)
Mild at heart catheterization in August.

## 2014-04-19 NOTE — Patient Instructions (Signed)
Your physician recommends that you schedule a follow-up appointment in: 6 weeks   Your physician recommends that you continue on your current medications as directed. Please refer to the Current Medication list given to you today.      Your physician has requested that you have an echocardiogram. Echocardiography is a painless test that uses sound waves to create images of your heart. It provides your doctor with information about the size and shape of your heart and how well your heart's chambers and valves are working. This procedure takes approximately one hour. There are no restrictions for this procedure.      Thank you for choosing [facility] !

## 2014-04-19 NOTE — Addendum Note (Signed)
Addended by: Barbarann Ehlers A on: 04/19/2014 01:22 PM   Modules accepted: Orders

## 2014-04-19 NOTE — Assessment & Plan Note (Signed)
Hospitalization in August with findings of only mild coronary atherosclerosis, LVEF was 40% at that time. She has chronic shortness of breath, chest pain symptoms that were limited as outlined above. No evidence of recurrent ACS or significant ECG change at ER visit. She states that she feels better now. We will obtain an echocardiogram to reassess LVEF, as normalization would be expected. Otherwise return to cardiac rehabilitation, perhaps at lower level. It was see her back of the next 6 weeks.

## 2014-04-22 DIAGNOSIS — I509 Heart failure, unspecified: Secondary | ICD-10-CM | POA: Diagnosis not present

## 2014-04-22 DIAGNOSIS — I252 Old myocardial infarction: Secondary | ICD-10-CM | POA: Diagnosis not present

## 2014-04-22 DIAGNOSIS — I10 Essential (primary) hypertension: Secondary | ICD-10-CM | POA: Diagnosis not present

## 2014-04-22 DIAGNOSIS — I5181 Takotsubo syndrome: Secondary | ICD-10-CM | POA: Diagnosis not present

## 2014-04-22 DIAGNOSIS — N390 Urinary tract infection, site not specified: Secondary | ICD-10-CM | POA: Diagnosis not present

## 2014-04-25 ENCOUNTER — Ambulatory Visit (HOSPITAL_COMMUNITY)
Admission: RE | Admit: 2014-04-25 | Discharge: 2014-04-25 | Disposition: A | Discharge status: Home / Self Care | Payer: Medicare Other | Source: Ambulatory Visit | Attending: Cardiology | Admitting: Cardiology

## 2014-04-25 DIAGNOSIS — I10 Essential (primary) hypertension: Secondary | ICD-10-CM | POA: Insufficient documentation

## 2014-04-25 DIAGNOSIS — N390 Urinary tract infection, site not specified: Secondary | ICD-10-CM | POA: Diagnosis not present

## 2014-04-25 DIAGNOSIS — I081 Rheumatic disorders of both mitral and tricuspid valves: Secondary | ICD-10-CM | POA: Insufficient documentation

## 2014-04-25 DIAGNOSIS — R079 Chest pain, unspecified: Secondary | ICD-10-CM | POA: Diagnosis not present

## 2014-04-25 DIAGNOSIS — I5181 Takotsubo syndrome: Secondary | ICD-10-CM | POA: Diagnosis not present

## 2014-04-25 DIAGNOSIS — R0600 Dyspnea, unspecified: Secondary | ICD-10-CM | POA: Insufficient documentation

## 2014-04-25 DIAGNOSIS — I371 Nonrheumatic pulmonary valve insufficiency: Secondary | ICD-10-CM | POA: Diagnosis not present

## 2014-04-25 DIAGNOSIS — K219 Gastro-esophageal reflux disease without esophagitis: Secondary | ICD-10-CM | POA: Diagnosis not present

## 2014-04-25 DIAGNOSIS — I2510 Atherosclerotic heart disease of native coronary artery without angina pectoris: Secondary | ICD-10-CM | POA: Diagnosis not present

## 2014-04-25 DIAGNOSIS — I420 Dilated cardiomyopathy: Secondary | ICD-10-CM | POA: Diagnosis not present

## 2014-04-25 DIAGNOSIS — I369 Nonrheumatic tricuspid valve disorder, unspecified: Secondary | ICD-10-CM | POA: Diagnosis not present

## 2014-04-25 DIAGNOSIS — I428 Other cardiomyopathies: Secondary | ICD-10-CM

## 2014-04-25 NOTE — Progress Notes (Signed)
  Echocardiogram 2D Echocardiogram has been performed.  [place], [place] 04/25/2014, 10:14 AM

## 2014-04-26 DIAGNOSIS — I10 Essential (primary) hypertension: Secondary | ICD-10-CM | POA: Diagnosis not present

## 2014-04-26 DIAGNOSIS — I252 Old myocardial infarction: Secondary | ICD-10-CM | POA: Diagnosis not present

## 2014-04-26 DIAGNOSIS — I509 Heart failure, unspecified: Secondary | ICD-10-CM | POA: Diagnosis not present

## 2014-04-26 DIAGNOSIS — I5181 Takotsubo syndrome: Secondary | ICD-10-CM | POA: Diagnosis not present

## 2014-05-01 DIAGNOSIS — I509 Heart failure, unspecified: Secondary | ICD-10-CM | POA: Diagnosis not present

## 2014-05-01 DIAGNOSIS — I10 Essential (primary) hypertension: Secondary | ICD-10-CM | POA: Diagnosis not present

## 2014-05-01 DIAGNOSIS — I252 Old myocardial infarction: Secondary | ICD-10-CM | POA: Diagnosis not present

## 2014-05-01 DIAGNOSIS — I5181 Takotsubo syndrome: Secondary | ICD-10-CM | POA: Diagnosis not present

## 2014-05-03 DIAGNOSIS — I5181 Takotsubo syndrome: Secondary | ICD-10-CM | POA: Diagnosis not present

## 2014-05-03 DIAGNOSIS — I10 Essential (primary) hypertension: Secondary | ICD-10-CM | POA: Diagnosis not present

## 2014-05-03 DIAGNOSIS — I509 Heart failure, unspecified: Secondary | ICD-10-CM | POA: Diagnosis not present

## 2014-05-03 DIAGNOSIS — I252 Old myocardial infarction: Secondary | ICD-10-CM | POA: Diagnosis not present

## 2014-05-06 DIAGNOSIS — I10 Essential (primary) hypertension: Secondary | ICD-10-CM | POA: Diagnosis not present

## 2014-05-06 DIAGNOSIS — I509 Heart failure, unspecified: Secondary | ICD-10-CM | POA: Diagnosis not present

## 2014-05-06 DIAGNOSIS — I252 Old myocardial infarction: Secondary | ICD-10-CM | POA: Diagnosis not present

## 2014-05-06 DIAGNOSIS — I5181 Takotsubo syndrome: Secondary | ICD-10-CM | POA: Diagnosis not present

## 2014-05-30 ENCOUNTER — Encounter (HOSPITAL_COMMUNITY): Payer: Self-pay | Admitting: Cardiovascular Disease

## 2014-06-07 ENCOUNTER — Encounter: Payer: Self-pay | Admitting: Cardiology

## 2014-06-07 ENCOUNTER — Ambulatory Visit (INDEPENDENT_AMBULATORY_CARE_PROVIDER_SITE_OTHER): Payer: Medicare Other | Admitting: Cardiology

## 2014-06-07 ENCOUNTER — Ambulatory Visit: Payer: Medicare Other | Admitting: Cardiology

## 2014-06-07 VITALS — BP 126/82 | HR 61 | Ht 66.0 in | Wt 158.0 lb

## 2014-06-07 DIAGNOSIS — I248 Other forms of acute ischemic heart disease: Secondary | ICD-10-CM

## 2014-06-07 DIAGNOSIS — I2510 Atherosclerotic heart disease of native coronary artery without angina pectoris: Secondary | ICD-10-CM

## 2014-06-07 DIAGNOSIS — I5181 Takotsubo syndrome: Secondary | ICD-10-CM | POA: Diagnosis not present

## 2014-06-07 NOTE — Assessment & Plan Note (Signed)
No angina symptoms with documentation of mild nonobstructive coronary atherosclerosis as of August 2015. Continue on medical therapy including aspirin, beta blocker, and ARB.

## 2014-06-07 NOTE — Progress Notes (Signed)
Reason for visit: Cardiomyopathy  Clinical Summary Nancy Winters is a 68 y.o.female last seen in October. She presents for a routine follow-up visit today. Overall doing well, no chest pain or palpitations, typically NYHA class II dyspnea. She has not been walking as regularly but plans to start this back and I encouraged her to try to get in for 5 days a week for exercise. She reports compliance with her medications. She continues on aspirin, Cozaar, and Lopressor.  Follow-up echocardiogram from November reported normalization of LV systolic function with LVEF 62-95%, normal diastolic parameters, mildly thickened aortic leaflets and mitral leaflets, no significant regurgitant lesions. I reviewed the results with her today.   Allergies  Allergen Reactions  . Ciprofloxacin     myalgia    Current Outpatient Prescriptions  Medication Sig Dispense Refill  . aspirin EC 81 MG EC tablet Take 1 tablet (81 mg total) by mouth daily.    . cholecalciferol (VITAMIN D) 1000 UNITS tablet Take 1,000 Units by mouth daily.    Marland Kitchen CRANBERRY PO Take 2 capsules by mouth daily.    Marland Kitchen esomeprazole (NEXIUM) 40 MG capsule Take 40 mg by mouth daily at 12 noon.    . fexofenadine-pseudoephedrine (ALLEGRA-D 24) 180-240 MG per 24 hr tablet Take 1 tablet by mouth daily as needed (for allergies).    . Glucosamine Sulfate-MSM (GLUCOSAMINE-MSM DS PO) Take 2 tablets by mouth daily.    Marland Kitchen loratadine (CLARITIN) 10 MG tablet Take 10 mg by mouth daily.    Marland Kitchen losartan (COZAAR) 25 MG tablet Take 1 tablet (25 mg total) by mouth daily. 90 tablet 3  . Magnesium 250 MG TABS Take 250 mg by mouth daily.    . metoprolol tartrate (LOPRESSOR) 25 MG tablet Take 1 tablet (25 mg total) by mouth 2 (two) times daily. 180 tablet 3  . Multiple Vitamins-Minerals (MULTIVITAMINS THER. W/MINERALS) TABS tablet Take 1 tablet by mouth daily.    . nitrofurantoin (MACRODANTIN) 50 MG capsule Take 50 mg by mouth at bedtime.    . nitroGLYCERIN (NITROSTAT) 0.4  MG SL tablet Place 1 tablet (0.4 mg total) under the tongue every 5 (five) minutes as needed for chest pain. 25 tablet 3  . ranitidine (ZANTAC) 300 MG tablet Take 300 mg by mouth at bedtime.    Marland Kitchen UNABLE TO FIND Take 500 mg by mouth daily. Med Name: Tumeric 500 mg daily     No current facility-administered medications for this visit.    Past Medical History  Diagnosis Date  . GERD (gastroesophageal reflux disease)   . Essential hypertension, benign   . Female bladder prolapse   . Takotsubo syndrome 02/13/2014    Echo 02/13/2014 LVEF 40%  . Coronary atherosclerosis     Mild, nonobstructive August 2015    Social History Nancy Winters reports that she has never smoked. She has never used smokeless tobacco. Nancy Winters reports that she does not drink alcohol.  Review of Systems Complete review of systems negative except as otherwise outlined in the clinical summary and also the following. Stable appetite. No palpitations or syncope. No orthopnea or PND.  Physical Examination Filed Vitals:   06/07/14 1354  BP: 126/82  Pulse: 61   Filed Weights   06/07/14 1354  Weight: 158 lb (71.668 kg)    Appears comfortable at rest. HEENT: Conjunctiva and lids normal, oropharynx clear. Neck: Supple, no elevated JVP or carotid bruits, no thyromegaly. Lungs: Clear to auscultation, nonlabored breathing at rest. Cardiac: Regular rate and rhythm, no  S3 or significant systolic murmur, no pericardial rub. Abdomen: Soft, nontender, bowel sounds present. Extremities: No pitting edema, distal pulses 2+.   Problem List and Plan   Takotsubo syndrome Patient has been clinically stable on medical therapy as outlined, and has shown normalization of LVEF by follow-up echocardiogram. I have encouraged regular walking regimen for exercise, and we will plan to see her back over the next 6 months.  Coronary atherosclerosis of native coronary artery No angina symptoms with documentation of mild nonobstructive  coronary atherosclerosis as of August 2015. Continue on medical therapy including aspirin, beta blocker, and ARB.    [name], M.D., F.A.C.C.

## 2014-06-07 NOTE — Assessment & Plan Note (Signed)
Patient has been clinically stable on medical therapy as outlined, and has shown normalization of LVEF by follow-up echocardiogram. I have encouraged regular walking regimen for exercise, and we will plan to see her back over the next 6 months.

## 2014-06-07 NOTE — Patient Instructions (Signed)
Your physician wants you to follow-up in: 6 months with Dr. McDowell You will receive a reminder letter in the mail two months in advance. If you don't receive a letter, please call our office to schedule the follow-up appointment.  Your physician recommends that you continue on your current medications as directed. Please refer to the Current Medication list given to you today.  Thank you for choosing [facility]!!    

## 2014-07-04 DIAGNOSIS — N8111 Cystocele, midline: Secondary | ICD-10-CM | POA: Diagnosis not present

## 2014-07-04 DIAGNOSIS — N951 Menopausal and female climacteric states: Secondary | ICD-10-CM | POA: Diagnosis not present

## 2014-07-04 DIAGNOSIS — Z1231 Encounter for screening mammogram for malignant neoplasm of breast: Secondary | ICD-10-CM | POA: Diagnosis not present

## 2014-07-04 DIAGNOSIS — Z124 Encounter for screening for malignant neoplasm of cervix: Secondary | ICD-10-CM | POA: Diagnosis not present

## 2014-07-04 DIAGNOSIS — Z1212 Encounter for screening for malignant neoplasm of rectum: Secondary | ICD-10-CM | POA: Diagnosis not present

## 2014-07-31 DIAGNOSIS — M79674 Pain in right toe(s): Secondary | ICD-10-CM | POA: Diagnosis not present

## 2014-07-31 DIAGNOSIS — S99929A Unspecified injury of unspecified foot, initial encounter: Secondary | ICD-10-CM | POA: Diagnosis not present

## 2014-09-10 DIAGNOSIS — S99929A Unspecified injury of unspecified foot, initial encounter: Secondary | ICD-10-CM | POA: Diagnosis not present

## 2014-09-10 DIAGNOSIS — M79674 Pain in right toe(s): Secondary | ICD-10-CM | POA: Diagnosis not present

## 2014-09-27 ENCOUNTER — Ambulatory Visit (INDEPENDENT_AMBULATORY_CARE_PROVIDER_SITE_OTHER): Payer: Medicare Other | Admitting: Adult Health

## 2014-09-27 ENCOUNTER — Encounter: Payer: Self-pay | Admitting: Adult Health

## 2014-09-27 VITALS — BP 118/80 | HR 62 | Ht 66.0 in | Wt 166.6 lb

## 2014-09-27 DIAGNOSIS — Z136 Encounter for screening for cardiovascular disorders: Secondary | ICD-10-CM

## 2014-09-27 DIAGNOSIS — R0602 Shortness of breath: Secondary | ICD-10-CM | POA: Diagnosis not present

## 2014-09-27 DIAGNOSIS — I10 Essential (primary) hypertension: Secondary | ICD-10-CM | POA: Diagnosis not present

## 2014-09-27 NOTE — Progress Notes (Signed)
Cardiology Office Note   Date:  09/27/2014   ID:  Jayce, Boyko 12/22/1945, MRN [id number]  PCP:  Delphina Cahill, MD  Cardiologist: McDowell/ Jory Sims, NP   Chief Complaint  Patient presents with  . Hypertension  . Cardiomyopathy    Takotsiubo  . Coronary Artery Disease      History of Present Illness: Nancy Winters is a 69 y.o. female who presents for ongoing assessment and management of hypertension, New York Heart Association class II dyspnea, history of Takotsubo syndrome, who was last seen by Dr. Domenic Polite, and 06/07/2014.  A followup echocardiogram completed revealed normal LV systolic function with LVEF of 60%.  She is clinically stable and is here for six-month followup.  She comes today with musculoskeletal complaints of neuralgia pain and some increased BP when she is stressed. She has not gotten more active as directed. Has gained some weight.   Past Medical History  Diagnosis Date  . GERD (gastroesophageal reflux disease)   . Essential hypertension, benign   . Female bladder prolapse   . Takotsubo syndrome 02/13/2014    Echo 02/13/2014 LVEF 40%  . Coronary atherosclerosis     Mild, nonobstructive August 2015    Past Surgical History  Procedure Laterality Date  . Tonsillectomy    . Tubal ligation    . Breast surgery Left 2011    Ducts removed  . Breast cyst excision Left 1979  . Left heart catheterization with coronary angiogram N/A 02/12/2014    Procedure: LEFT HEART CATHETERIZATION WITH CORONARY ANGIOGRAM;  Surgeon: Troy Sine, MD;  Location: [facility] CATH LAB;  Service: Cardiovascular;  Laterality: N/A;     Current Outpatient Prescriptions  Medication Sig Dispense Refill  . aspirin EC 81 MG EC tablet Take 1 tablet (81 mg total) by mouth daily.    . cholecalciferol (VITAMIN D) 1000 UNITS tablet Take 1,000 Units by mouth daily.    Marland Kitchen esomeprazole (NEXIUM) 40 MG capsule Take 40 mg by mouth daily at 12 noon.    . fexofenadine-pseudoephedrine  (ALLEGRA-D 24) 180-240 MG per 24 hr tablet Take 1 tablet by mouth daily as needed (for allergies).    . Glucosamine Sulfate-MSM (GLUCOSAMINE-MSM DS PO) Take 2 tablets by mouth daily.    Marland Kitchen loratadine (CLARITIN) 10 MG tablet Take 10 mg by mouth daily.    Marland Kitchen losartan (COZAAR) 25 MG tablet Take 1 tablet (25 mg total) by mouth daily. 90 tablet 3  . Magnesium 250 MG TABS Take 250 mg by mouth daily.    . metoprolol tartrate (LOPRESSOR) 25 MG tablet Take 1 tablet (25 mg total) by mouth 2 (two) times daily. 180 tablet 3  . Multiple Vitamins-Minerals (MULTIVITAMINS THER. W/MINERALS) TABS tablet Take 1 tablet by mouth daily.    . nitrofurantoin (MACRODANTIN) 50 MG capsule Take 50 mg by mouth at bedtime.    . nitroGLYCERIN (NITROSTAT) 0.4 MG SL tablet Place 1 tablet (0.4 mg total) under the tongue every 5 (five) minutes as needed for chest pain. 25 tablet 3  . ranitidine (ZANTAC) 300 MG tablet Take 300 mg by mouth at bedtime.     No current facility-administered medications for this visit.    Allergies:   Ciprofloxacin    Social History:  The patient  reports that she has never smoked. She has never used smokeless tobacco. She reports that she does not drink alcohol or use illicit drugs.   Family History:  The patient's family history is not on file.  ROS: .   All other systems are reviewed and negative.Unless otherwise mentioned in H&P above.   PHYSICAL EXAM: VS:  BP 118/80 mmHg  Pulse 62  Ht 5\' 6"  (1.676 m)  Wt 166 lb 9.6 oz (75.569 kg)  BMI 26.90 kg/m2  SpO2 99% , BMI Body mass index is 26.9 kg/(m^2). GEN: Well nourished, well developed, in no acute distress HEENT: normal Neck: no JVD, carotid bruits, or masses Cardiac: RRR; no murmurs, rubs, or gallops,no edema  Respiratory:  clear to auscultation bilaterally, normal work of breathing GI: soft, nontender, nondistended, + BS MS: no deformity or atrophy Skin: warm and dry, no rash Neuro:  Strength and sensation are intact Psych:  euthymic mood, full affect   EKG: The ekg ordered today demonstrates NSR with sinus arrhythmia. Evidence of old anterior infarct.    Recent Labs: 02/12/2014: Magnesium 2.2; TSH 1.130 02/14/2014: Pro B Natriuretic peptide (BNP) 2777.0* 04/17/2014: ALT 18; BUN 17; Creatinine 1.13*; Hemoglobin 12.3; Platelets 283; Potassium 3.9; Sodium 138    Lipid Panel    Component Value Date/Time   CHOL 168 02/12/2014 0215   TRIG 82 02/12/2014 0215   HDL 64 02/12/2014 0215   CHOLHDL 2.6 02/12/2014 0215   VLDL 16 02/12/2014 0215   LDLCALC 88 02/12/2014 0215      Wt Readings from Last 3 Encounters:  09/27/14 166 lb 9.6 oz (75.569 kg)  06/07/14 158 lb (71.668 kg)  04/19/14 152 lb (68.947 kg)      Other studies Reviewed: Additional studies/ records that were reviewed today include: Cath one year ago with 20% LAD. Other coronaries are normal.  Review of the above records demonstrates: Mild CAD   ASSESSMENT AND PLAN:  1. CAD: She will continue on current medication regimen with metoprolol. Lipids have been checked in Nov by PCP. We will request copies. She is not active. I am re-referring her to cardiac rehab.   2. Hypertension: Has been elevated at home at times. She is to adhere to low sodium diet and increase activity, lose weight.  Will see her in one year unless symptomatic   Current medicines are reviewed at length with the patient today.    Labs/ tests ordered today include: Request of labs  Orders Placed This Encounter  Procedures  . EKG 12-Lead     Disposition:   FU with 1 year.  Signed, Jory Sims, NP  09/27/2014 1:34 PM    [facility] [address] [place], [place] [address] Phone: [phone]; Fax: [phone]

## 2014-09-27 NOTE — Patient Instructions (Signed)
Your physician wants you to follow-up in: 1 year with Jory Sims, NP.  You will receive a reminder letter in the mail two months in advance. If you don't receive a letter, please call our office to schedule the follow-up appointment.  Your physician recommends that you continue on your current medications as directed. Please refer to the Current Medication list given to you today.  You have been referred to Cardiac Rehab  Thank you for choosing [facility]!

## 2014-09-27 NOTE — Progress Notes (Deleted)
Name: Nancy Winters    DOB: 25-May-1946  Age: 69 y.o.  MR#: [id number]       PCP:  Delphina Cahill, MD      Insurance: Payor: MEDICARE / Plan: MEDICARE PART A AND B / Product Type: *No Product type* /   CC:    Chief Complaint  Patient presents with  . Hypertension  . Cardiomyopathy    Takotsiubo  . Coronary Artery Disease    VS Filed Vitals:   09/27/14 1310  BP: 118/80  Pulse: 62  Height: 5\' 6"  (1.676 m)  Weight: 166 lb 9.6 oz (75.569 kg)  SpO2: 99%    Weights Current Weight  09/27/14 166 lb 9.6 oz (75.569 kg)  06/07/14 158 lb (71.668 kg)  04/19/14 152 lb (68.947 kg)    Blood Pressure  BP Readings from Last 3 Encounters:  09/27/14 118/80  06/07/14 126/82  04/19/14 120/78     Admit date:  (Not on file) Last encounter with RMR:  Visit date not found   Allergy Ciprofloxacin  Current Outpatient Prescriptions  Medication Sig Dispense Refill  . aspirin EC 81 MG EC tablet Take 1 tablet (81 mg total) by mouth daily.    . cholecalciferol (VITAMIN D) 1000 UNITS tablet Take 1,000 Units by mouth daily.    Marland Kitchen esomeprazole (NEXIUM) 40 MG capsule Take 40 mg by mouth daily at 12 noon.    . fexofenadine-pseudoephedrine (ALLEGRA-D 24) 180-240 MG per 24 hr tablet Take 1 tablet by mouth daily as needed (for allergies).    . Glucosamine Sulfate-MSM (GLUCOSAMINE-MSM DS PO) Take 2 tablets by mouth daily.    Marland Kitchen loratadine (CLARITIN) 10 MG tablet Take 10 mg by mouth daily.    Marland Kitchen losartan (COZAAR) 25 MG tablet Take 1 tablet (25 mg total) by mouth daily. 90 tablet 3  . Magnesium 250 MG TABS Take 250 mg by mouth daily.    . metoprolol tartrate (LOPRESSOR) 25 MG tablet Take 1 tablet (25 mg total) by mouth 2 (two) times daily. 180 tablet 3  . Multiple Vitamins-Minerals (MULTIVITAMINS THER. W/MINERALS) TABS tablet Take 1 tablet by mouth daily.    . nitrofurantoin (MACRODANTIN) 50 MG capsule Take 50 mg by mouth at bedtime.    . nitroGLYCERIN (NITROSTAT) 0.4 MG SL tablet Place 1 tablet (0.4 mg total)  under the tongue every 5 (five) minutes as needed for chest pain. 25 tablet 3  . ranitidine (ZANTAC) 300 MG tablet Take 300 mg by mouth at bedtime.     No current facility-administered medications for this visit.    Discontinued Meds:    Medications Discontinued During This Encounter  Medication Reason  . UNABLE TO FIND Error  . CRANBERRY PO Error    Patient Active Problem List   Diagnosis Date Noted  . Takotsubo syndrome 02/13/2014  . Coronary atherosclerosis of native coronary artery 02/13/2014  . NICM (nonischemic cardiomyopathy) EF 35% 02/13/2014  . GERD (gastroesophageal reflux disease) 05/14/2013  . Precordial pain 05/14/2013  . History of TIA (transient ischemic attack) 05/14/2013    LABS    Component Value Date/Time   NA 138 04/17/2014 2308   NA 141 02/14/2014 0303   NA 144 02/12/2014 0215   K 3.9 04/17/2014 2308   K 4.0 02/14/2014 0303   K 4.2 02/12/2014 1129   CL 101 04/17/2014 2308   CL 107 02/14/2014 0303   CL 107 02/12/2014 0215   CO2 23 04/17/2014 2308   CO2 21 02/14/2014 0303   CO2 23  02/12/2014 0215   GLUCOSE 98 04/17/2014 2308   GLUCOSE 107* 02/14/2014 0303   GLUCOSE 129* 02/12/2014 0215   BUN 17 04/17/2014 2308   BUN 13 02/14/2014 0303   BUN 11 02/12/2014 0215   CREATININE 1.13* 04/17/2014 2308   CREATININE 0.86 02/14/2014 0303   CREATININE 0.76 02/12/2014 1129   CALCIUM 9.8 04/17/2014 2308   CALCIUM 9.2 02/14/2014 0303   CALCIUM 9.5 02/12/2014 0215   GFRNONAA 49* 04/17/2014 2308   GFRNONAA 68* 02/14/2014 0303   GFRNONAA 85* 02/12/2014 1129   GFRAA 57* 04/17/2014 2308   GFRAA 79* 02/14/2014 0303   GFRAA >90 02/12/2014 1129   CMP     Component Value Date/Time   NA 138 04/17/2014 2308   K 3.9 04/17/2014 2308   CL 101 04/17/2014 2308   CO2 23 04/17/2014 2308   GLUCOSE 98 04/17/2014 2308   BUN 17 04/17/2014 2308   CREATININE 1.13* 04/17/2014 2308   CALCIUM 9.8 04/17/2014 2308   PROT 7.4 04/17/2014 2308   ALBUMIN 4.1 04/17/2014 2308    AST 19 04/17/2014 2308   ALT 18 04/17/2014 2308   ALKPHOS 120* 04/17/2014 2308   BILITOT 0.3 04/17/2014 2308   GFRNONAA 49* 04/17/2014 2308   GFRAA 57* 04/17/2014 2308       Component Value Date/Time   WBC 6.9 04/17/2014 2308   WBC 6.3 02/14/2014 0303   WBC 7.3 02/13/2014 0321   HGB 12.3 04/17/2014 2308   HGB 11.2* 02/14/2014 0303   HGB 11.8* 02/13/2014 0321   HCT 35.9* 04/17/2014 2308   HCT 33.0* 02/14/2014 0303   HCT 35.5* 02/13/2014 0321   MCV 90.2 04/17/2014 2308   MCV 91.7 02/14/2014 0303   MCV 89.6 02/13/2014 0321    Lipid Panel     Component Value Date/Time   CHOL 168 02/12/2014 0215   TRIG 82 02/12/2014 0215   HDL 64 02/12/2014 0215   CHOLHDL 2.6 02/12/2014 0215   VLDL 16 02/12/2014 0215   LDLCALC 88 02/12/2014 0215    ABG No results found for: PHART, PCO2ART, PO2ART, HCO3, TCO2, ACIDBASEDEF, O2SAT   Lab Results  Component Value Date   TSH 1.130 02/12/2014   BNP (last 3 results) No results for input(s): BNP in the last 8760 hours.  ProBNP (last 3 results)  Recent Labs  02/12/14 0215 02/14/14 0303  PROBNP 938.3* 2777.0*    Cardiac Panel (last 3 results) No results for input(s): CKTOTAL, CKMB, TROPONINI, RELINDX in the last 72 hours.  Iron/TIBC/Ferritin/ %Sat No results found for: IRON, TIBC, FERRITIN, IRONPCTSAT   EKG Orders placed or performed in visit on 09/27/14  . EKG 12-Lead     Prior Assessment and Plan Problem List as of 09/27/2014      Cardiovascular and Mediastinum   Takotsubo syndrome   Last Assessment & Plan 06/07/2014 Office Visit Written 06/07/2014  2:22 PM by [name], MD    Patient has been clinically stable on medical therapy as outlined, and has shown normalization of LVEF by follow-up echocardiogram. I have encouraged regular walking regimen for exercise, and we will plan to see her back over the next 6 months.      Coronary atherosclerosis of native coronary artery   Last Assessment & Plan 06/07/2014 Office  Visit Written 06/07/2014  2:23 PM by [name], MD    No angina symptoms with documentation of mild nonobstructive coronary atherosclerosis as of August 2015. Continue on medical therapy including aspirin, beta blocker, and ARB.  NICM (nonischemic cardiomyopathy) EF 35%   Last Assessment & Plan 03/18/2014 Office Visit Written 03/18/2014  1:28 PM by Lendon Colonel, NP    No complaints of fluid overload or dyspnea. Continue current medications.         Digestive   GERD (gastroesophageal reflux disease)   Last Assessment & Plan 05/14/2013 Office Visit Written 05/14/2013  2:44 PM by [name], MD    Continues on antacids.        Other   Precordial pain   Last Assessment & Plan 05/14/2013 Office Visit Written 05/14/2013  2:42 PM by [name], MD    Resolved, although still feels intermittent shortness of breath. Recent Cardiolite indicates no ischemia. We will go ahead and obtain an echocardiogram to better assess cardiac structure and function , if reassuring doubt that we need to pursue further cardiac testing unless her symptoms progress.      History of TIA (transient ischemic attack)   Last Assessment & Plan 05/14/2013 Office Visit Written 05/14/2013  2:43 PM by [name], MD    Questionable, reports brief period, when her speech was "off" several weeks ago. She does not have any carotid bruits. We will obtain carotid Dopplers to exclude significant stenosis. Otherwise continue on aspirin.          Imaging: No results found.

## 2014-10-03 DIAGNOSIS — Z9861 Coronary angioplasty status: Secondary | ICD-10-CM | POA: Diagnosis not present

## 2014-10-03 DIAGNOSIS — R0602 Shortness of breath: Secondary | ICD-10-CM | POA: Diagnosis not present

## 2014-10-03 DIAGNOSIS — R079 Chest pain, unspecified: Secondary | ICD-10-CM | POA: Diagnosis not present

## 2014-10-03 DIAGNOSIS — R0789 Other chest pain: Secondary | ICD-10-CM | POA: Diagnosis not present

## 2014-10-03 DIAGNOSIS — I2510 Atherosclerotic heart disease of native coronary artery without angina pectoris: Secondary | ICD-10-CM | POA: Diagnosis not present

## 2014-10-03 DIAGNOSIS — R110 Nausea: Secondary | ICD-10-CM | POA: Diagnosis not present

## 2014-10-03 DIAGNOSIS — K219 Gastro-esophageal reflux disease without esophagitis: Secondary | ICD-10-CM | POA: Diagnosis not present

## 2014-10-03 DIAGNOSIS — R072 Precordial pain: Secondary | ICD-10-CM | POA: Diagnosis not present

## 2014-10-03 DIAGNOSIS — R55 Syncope and collapse: Secondary | ICD-10-CM | POA: Diagnosis not present

## 2014-10-03 DIAGNOSIS — I10 Essential (primary) hypertension: Secondary | ICD-10-CM | POA: Diagnosis not present

## 2014-10-04 DIAGNOSIS — R079 Chest pain, unspecified: Secondary | ICD-10-CM | POA: Diagnosis not present

## 2014-10-04 DIAGNOSIS — R0602 Shortness of breath: Secondary | ICD-10-CM | POA: Diagnosis not present

## 2014-10-04 DIAGNOSIS — I10 Essential (primary) hypertension: Secondary | ICD-10-CM | POA: Diagnosis not present

## 2014-10-04 DIAGNOSIS — R0789 Other chest pain: Secondary | ICD-10-CM | POA: Diagnosis not present

## 2014-10-22 DIAGNOSIS — H2513 Age-related nuclear cataract, bilateral: Secondary | ICD-10-CM | POA: Diagnosis not present

## 2014-11-29 DIAGNOSIS — N390 Urinary tract infection, site not specified: Secondary | ICD-10-CM | POA: Diagnosis not present

## 2014-11-29 DIAGNOSIS — R079 Chest pain, unspecified: Secondary | ICD-10-CM | POA: Diagnosis not present

## 2015-03-26 DIAGNOSIS — I10 Essential (primary) hypertension: Secondary | ICD-10-CM | POA: Diagnosis not present

## 2015-03-28 DIAGNOSIS — R079 Chest pain, unspecified: Secondary | ICD-10-CM | POA: Diagnosis not present

## 2015-03-28 DIAGNOSIS — K219 Gastro-esophageal reflux disease without esophagitis: Secondary | ICD-10-CM | POA: Diagnosis not present

## 2015-03-28 DIAGNOSIS — N390 Urinary tract infection, site not specified: Secondary | ICD-10-CM | POA: Diagnosis not present

## 2015-03-28 DIAGNOSIS — E785 Hyperlipidemia, unspecified: Secondary | ICD-10-CM | POA: Diagnosis not present

## 2015-03-28 DIAGNOSIS — Z23 Encounter for immunization: Secondary | ICD-10-CM | POA: Diagnosis not present

## 2015-04-11 DIAGNOSIS — K219 Gastro-esophageal reflux disease without esophagitis: Secondary | ICD-10-CM | POA: Diagnosis not present

## 2015-04-11 DIAGNOSIS — R079 Chest pain, unspecified: Secondary | ICD-10-CM | POA: Diagnosis not present

## 2015-04-11 DIAGNOSIS — I10 Essential (primary) hypertension: Secondary | ICD-10-CM | POA: Diagnosis not present

## 2015-04-11 DIAGNOSIS — E785 Hyperlipidemia, unspecified: Secondary | ICD-10-CM | POA: Diagnosis not present

## 2015-05-01 DIAGNOSIS — L03039 Cellulitis of unspecified toe: Secondary | ICD-10-CM | POA: Diagnosis not present

## 2015-05-05 DIAGNOSIS — L03011 Cellulitis of right finger: Secondary | ICD-10-CM | POA: Diagnosis not present

## 2015-05-05 DIAGNOSIS — L03019 Cellulitis of unspecified finger: Secondary | ICD-10-CM | POA: Diagnosis not present

## 2015-05-19 DIAGNOSIS — L03011 Cellulitis of right finger: Secondary | ICD-10-CM | POA: Diagnosis not present

## 2015-05-19 DIAGNOSIS — M79674 Pain in right toe(s): Secondary | ICD-10-CM | POA: Diagnosis not present

## 2015-07-08 ENCOUNTER — Other Ambulatory Visit: Payer: Self-pay | Admitting: Cardiology

## 2015-07-11 DIAGNOSIS — N644 Mastodynia: Secondary | ICD-10-CM | POA: Diagnosis not present

## 2015-07-11 DIAGNOSIS — L739 Follicular disorder, unspecified: Secondary | ICD-10-CM | POA: Diagnosis not present

## 2015-07-11 DIAGNOSIS — Z780 Asymptomatic menopausal state: Secondary | ICD-10-CM | POA: Diagnosis not present

## 2015-07-11 DIAGNOSIS — Z1212 Encounter for screening for malignant neoplasm of rectum: Secondary | ICD-10-CM | POA: Diagnosis not present

## 2015-07-11 DIAGNOSIS — Z1231 Encounter for screening mammogram for malignant neoplasm of breast: Secondary | ICD-10-CM | POA: Diagnosis not present

## 2015-07-11 DIAGNOSIS — M8580 Other specified disorders of bone density and structure, unspecified site: Secondary | ICD-10-CM | POA: Diagnosis not present

## 2015-07-17 DIAGNOSIS — L600 Ingrowing nail: Secondary | ICD-10-CM | POA: Diagnosis not present

## 2015-07-17 DIAGNOSIS — L601 Onycholysis: Secondary | ICD-10-CM | POA: Diagnosis not present

## 2015-08-25 DIAGNOSIS — L601 Onycholysis: Secondary | ICD-10-CM | POA: Diagnosis not present

## 2015-08-25 DIAGNOSIS — L600 Ingrowing nail: Secondary | ICD-10-CM | POA: Diagnosis not present

## 2015-09-12 DIAGNOSIS — R0981 Nasal congestion: Secondary | ICD-10-CM | POA: Diagnosis not present

## 2015-09-12 DIAGNOSIS — J111 Influenza due to unidentified influenza virus with other respiratory manifestations: Secondary | ICD-10-CM | POA: Diagnosis not present

## 2015-09-12 DIAGNOSIS — R51 Headache: Secondary | ICD-10-CM | POA: Diagnosis not present

## 2015-10-15 DIAGNOSIS — E785 Hyperlipidemia, unspecified: Secondary | ICD-10-CM | POA: Diagnosis not present

## 2015-10-15 DIAGNOSIS — I10 Essential (primary) hypertension: Secondary | ICD-10-CM | POA: Diagnosis not present

## 2015-10-17 DIAGNOSIS — K219 Gastro-esophageal reflux disease without esophagitis: Secondary | ICD-10-CM | POA: Diagnosis not present

## 2015-10-17 DIAGNOSIS — E785 Hyperlipidemia, unspecified: Secondary | ICD-10-CM | POA: Diagnosis not present

## 2015-10-17 DIAGNOSIS — I10 Essential (primary) hypertension: Secondary | ICD-10-CM | POA: Diagnosis not present

## 2015-12-01 ENCOUNTER — Encounter: Payer: Self-pay | Admitting: Cardiology

## 2015-12-01 ENCOUNTER — Ambulatory Visit (INDEPENDENT_AMBULATORY_CARE_PROVIDER_SITE_OTHER): Payer: Medicare Other | Admitting: Cardiology

## 2015-12-01 VITALS — BP 136/76 | HR 51 | Ht 66.0 in | Wt 173.0 lb

## 2015-12-01 DIAGNOSIS — E785 Hyperlipidemia, unspecified: Secondary | ICD-10-CM | POA: Diagnosis not present

## 2015-12-01 DIAGNOSIS — I2510 Atherosclerotic heart disease of native coronary artery without angina pectoris: Secondary | ICD-10-CM

## 2015-12-01 DIAGNOSIS — I5181 Takotsubo syndrome: Secondary | ICD-10-CM

## 2015-12-01 DIAGNOSIS — I10 Essential (primary) hypertension: Secondary | ICD-10-CM

## 2015-12-01 MED ORDER — METOPROLOL TARTRATE 25 MG PO TABS: 12.5000 mg | ORAL_TABLET | Freq: Two times a day (BID) | ORAL | Status: DC

## 2015-12-01 NOTE — Progress Notes (Signed)
Cardiology Office Note  Date: 12/01/2015   ID: Nancy, Winters 1945-12-23, MRN [id number]  PCP: Wende Neighbors, MD  Primary Cardiologist: Rozann Lesches, MD   Chief Complaint  Patient presents with  . History of stress induced cardiomyopathy  . Coronary Artery Disease    History of Present Illness: Nancy Winters is a 70 y.o. female last seen by Ms. Lawrence NP in April 2016. She presents for a routine follow-up visit. States that she has not been exercising, her weight has gone up 6 pounds since last year. She reports NYHA class II dyspnea, sometimes worse if she walks up hill. She has had no angina symptoms.  Echocardiogram from 2015 showed normalization of LVEF. We discussed this today. Follow-up ECG shows sinus bradycardia with sinus arrhythmia.  I reviewed her medications are outlined below. We discussed reducing the dose of Lopressor in case bradycardia is holding her back somewhat.  We also reviewed her me his recent lab work per Dr. Nevada Crane as outlined below.  Past Medical History  Diagnosis Date  . GERD (gastroesophageal reflux disease)   . Essential hypertension, benign   . Female bladder prolapse   . Takotsubo syndrome 02/13/2014    Echo 02/13/2014 LVEF 40%  . Coronary atherosclerosis     Mild, nonobstructive August 2015    Past Surgical History  Procedure Laterality Date  . Tonsillectomy    . Tubal ligation    . Breast surgery Left 2011    Ducts removed  . Breast cyst excision Left 1979  . Left heart catheterization with coronary angiogram N/A 02/12/2014    Procedure: LEFT HEART CATHETERIZATION WITH CORONARY ANGIOGRAM;  Surgeon: Troy Sine, MD;  Location: [facility] CATH LAB;  Service: Cardiovascular;  Laterality: N/A;    Current Outpatient Prescriptions  Medication Sig Dispense Refill  . atorvastatin (LIPITOR) 20 MG tablet Take 20 mg by mouth daily.    . Coenzyme Q10 (CO Q-10) 200 MG CAPS Take 200 mg by mouth at bedtime.    . vitamin B-12 (CYANOCOBALAMIN)  1000 MCG tablet Take 1,000 mcg by mouth daily.    Marland Kitchen aspirin EC 81 MG EC tablet Take 1 tablet (81 mg total) by mouth daily.    . cholecalciferol (VITAMIN D) 1000 UNITS tablet Take 1,000 Units by mouth daily.    Marland Kitchen esomeprazole (NEXIUM) 40 MG capsule Take 40 mg by mouth daily at 12 noon.    . Glucosamine Sulfate-MSM (GLUCOSAMINE-MSM DS PO) Take 2 tablets by mouth daily.    Marland Kitchen losartan (COZAAR) 25 MG tablet Take 1 tablet (25 mg total) by mouth daily. 90 tablet 3  . Magnesium 250 MG TABS Take 250 mg by mouth daily.    . metoprolol tartrate (LOPRESSOR) 25 MG tablet Take 1 tablet (25 mg total) by mouth 2 (two) times daily. 180 tablet 3  . Multiple Vitamins-Minerals (MULTIVITAMINS THER. W/MINERALS) TABS tablet Take 1 tablet by mouth daily.    . nitrofurantoin (MACRODANTIN) 50 MG capsule Take 50 mg by mouth at bedtime.    . nitroGLYCERIN (NITROSTAT) 0.4 MG SL tablet PLACE 1 TABLET UNDER THE TONGUE EVERY 5 MINUTES AS NEEDED FOR CHEST PAIN 25 tablet 3  . ranitidine (ZANTAC) 300 MG tablet Take 300 mg by mouth at bedtime.     No current facility-administered medications for this visit.   Allergies:  Ciprofloxacin   Social History: The patient  reports that she has never smoked. She has never used smokeless tobacco. She reports that she does not drink  alcohol or use illicit drugs.   ROS:  Please see the history of present illness. Otherwise, complete review of systems is positive for none.  All other systems are reviewed and negative.   Physical Exam: VS:  BP 136/76 mmHg  Pulse 51  Ht 5\' 6"  (1.676 m)  Wt 173 lb (78.472 kg)  BMI 27.94 kg/m2  SpO2 98%, BMI Body mass index is 27.94 kg/(m^2).  Wt Readings from Last 3 Encounters:  12/01/15 173 lb (78.472 kg)  09/27/14 166 lb 9.6 oz (75.569 kg)  06/07/14 158 lb (71.668 kg)    Overweight woman, comfortable at rest. HEENT: Conjunctiva and lids normal, oropharynx clear. Neck: Supple, no elevated JVP or carotid bruits, no thyromegaly. Lungs: Clear to  auscultation, nonlabored breathing at rest. Cardiac: Regular rate and rhythm, no S3 or significant systolic murmur, no pericardial rub. Abdomen: Soft, nontender, bowel sounds present. Extremities: No pitting edema, distal pulses 2+. Skin: Warm and dry. Musculoskeletal: No kyphosis. Neuropsychiatric: Alert and oriented 3, affect appropriate.  ECG: I personally reviewed the tracing from 09/27/2014 which showed sinus arrhythmia.  Recent Labwork:  April 2017: potassium 4.0, BUN 11, creatinine 0.8, AST 13, ALT 15, Hgb 13.3, platelets 237, cholesterol 194, triglycerides 214, HDL 42, LDL 107  Other Studies Reviewed Today:  Echocardiogram 04/25/2014: Study Conclusions  - Left ventricle: The cavity size was normal. Wall thickness was normal. Systolic function was normal. The estimated ejection fraction was in the range of 55% to 60%. Wall motion was normal; there were no regional wall motion abnormalities. Left ventricular diastolic function parameters were normal. - Aortic valve: Mildly calcified annulus. Trileaflet; mildly thickened leaflets. - Mitral valve: Mildly calcified annulus. Normal thickness leaflets. - Atrial septum: No defect or patent foramen ovale was identified. - Technically adequate study.  Assessment and Plan:  1. History of stress-induced cardiomyopathy with normalization of LVEF. I have encouraged regular exercise plan and maintenance of healthier weight. As far as medical therapy, we will reduce her beta blocker dose to ensure that bradycardia is not a limiting issue for her.  2. Hyperlipidemia, placed on Lipitor and followed by Dr. Nevada Crane. Recent LDL 107.  3. Mild, nonobstructive CAD as of cardiac catheterization in August 2015. No active angina symptoms. She continues on aspirin and statin.  4. Essential hypertension, no changes made to current antihypertensive regimen other than the reduction in beta blocker.  Current medicines were reviewed with the  patient today.   Orders Placed This Encounter  Procedures  . EKG 12-Lead    Disposition: FU with me in 1 year.   Signed, [name], MD, [facility] 12/01/2015 1:21 PM    [facility] at [facility] [address] [place], [place] [address] Phone: [phone]; Fax: [phone]

## 2015-12-01 NOTE — Patient Instructions (Signed)
Your physician wants you to follow-up in: 1 year You will receive a reminder letter in the mail two months in advance. If you don't receive a letter, please call our office to schedule the follow-up appointment.    DECREASE Lopressor to 12.5 mg twice a day    If you need a refill on your cardiac medications before your next appointment, please call your pharmacy.      Thank you for choosing [facility] !

## 2015-12-02 DIAGNOSIS — H43813 Vitreous degeneration, bilateral: Secondary | ICD-10-CM | POA: Diagnosis not present

## 2015-12-02 DIAGNOSIS — H524 Presbyopia: Secondary | ICD-10-CM | POA: Diagnosis not present

## 2015-12-02 DIAGNOSIS — H1045 Other chronic allergic conjunctivitis: Secondary | ICD-10-CM | POA: Diagnosis not present

## 2015-12-02 DIAGNOSIS — H2513 Age-related nuclear cataract, bilateral: Secondary | ICD-10-CM | POA: Diagnosis not present

## 2015-12-15 DIAGNOSIS — M79675 Pain in left toe(s): Secondary | ICD-10-CM | POA: Diagnosis not present

## 2015-12-15 DIAGNOSIS — L600 Ingrowing nail: Secondary | ICD-10-CM | POA: Diagnosis not present

## 2015-12-29 DIAGNOSIS — M79674 Pain in right toe(s): Secondary | ICD-10-CM | POA: Diagnosis not present

## 2015-12-29 DIAGNOSIS — L600 Ingrowing nail: Secondary | ICD-10-CM | POA: Diagnosis not present

## 2016-01-12 DIAGNOSIS — L600 Ingrowing nail: Secondary | ICD-10-CM | POA: Diagnosis not present

## 2016-02-09 DIAGNOSIS — L600 Ingrowing nail: Secondary | ICD-10-CM | POA: Diagnosis not present

## 2016-02-18 ENCOUNTER — Other Ambulatory Visit: Payer: Self-pay

## 2016-03-08 DIAGNOSIS — L600 Ingrowing nail: Secondary | ICD-10-CM | POA: Diagnosis not present

## 2016-03-29 DIAGNOSIS — L600 Ingrowing nail: Secondary | ICD-10-CM | POA: Diagnosis not present

## 2016-04-02 DIAGNOSIS — E785 Hyperlipidemia, unspecified: Secondary | ICD-10-CM | POA: Diagnosis not present

## 2016-04-02 DIAGNOSIS — R7301 Impaired fasting glucose: Secondary | ICD-10-CM | POA: Diagnosis not present

## 2016-04-02 DIAGNOSIS — I482 Chronic atrial fibrillation: Secondary | ICD-10-CM | POA: Diagnosis not present

## 2016-04-02 DIAGNOSIS — E119 Type 2 diabetes mellitus without complications: Secondary | ICD-10-CM | POA: Diagnosis not present

## 2016-04-02 DIAGNOSIS — E782 Mixed hyperlipidemia: Secondary | ICD-10-CM | POA: Diagnosis not present

## 2016-04-02 DIAGNOSIS — E039 Hypothyroidism, unspecified: Secondary | ICD-10-CM | POA: Diagnosis not present

## 2016-04-02 DIAGNOSIS — D509 Iron deficiency anemia, unspecified: Secondary | ICD-10-CM | POA: Diagnosis not present

## 2016-04-02 DIAGNOSIS — I10 Essential (primary) hypertension: Secondary | ICD-10-CM | POA: Diagnosis not present

## 2016-04-06 DIAGNOSIS — I10 Essential (primary) hypertension: Secondary | ICD-10-CM | POA: Diagnosis not present

## 2016-04-06 DIAGNOSIS — E785 Hyperlipidemia, unspecified: Secondary | ICD-10-CM | POA: Diagnosis not present

## 2016-04-06 DIAGNOSIS — N390 Urinary tract infection, site not specified: Secondary | ICD-10-CM | POA: Diagnosis not present

## 2016-04-06 DIAGNOSIS — Z0001 Encounter for general adult medical examination with abnormal findings: Secondary | ICD-10-CM | POA: Diagnosis not present

## 2016-04-06 DIAGNOSIS — Z23 Encounter for immunization: Secondary | ICD-10-CM | POA: Diagnosis not present

## 2016-04-06 DIAGNOSIS — K219 Gastro-esophageal reflux disease without esophagitis: Secondary | ICD-10-CM | POA: Diagnosis not present

## 2016-07-12 DIAGNOSIS — M8580 Other specified disorders of bone density and structure, unspecified site: Secondary | ICD-10-CM | POA: Diagnosis not present

## 2016-07-12 DIAGNOSIS — N904 Leukoplakia of vulva: Secondary | ICD-10-CM | POA: Diagnosis not present

## 2016-07-12 DIAGNOSIS — Z780 Asymptomatic menopausal state: Secondary | ICD-10-CM | POA: Diagnosis not present

## 2016-07-12 DIAGNOSIS — N8111 Cystocele, midline: Secondary | ICD-10-CM | POA: Diagnosis not present

## 2016-07-12 DIAGNOSIS — Z124 Encounter for screening for malignant neoplasm of cervix: Secondary | ICD-10-CM | POA: Diagnosis not present

## 2016-07-12 DIAGNOSIS — Z1212 Encounter for screening for malignant neoplasm of rectum: Secondary | ICD-10-CM | POA: Diagnosis not present

## 2016-07-12 DIAGNOSIS — Z1231 Encounter for screening mammogram for malignant neoplasm of breast: Secondary | ICD-10-CM | POA: Diagnosis not present

## 2016-07-20 DIAGNOSIS — R922 Inconclusive mammogram: Secondary | ICD-10-CM | POA: Diagnosis not present

## 2016-09-23 DIAGNOSIS — M9904 Segmental and somatic dysfunction of sacral region: Secondary | ICD-10-CM | POA: Diagnosis not present

## 2016-09-23 DIAGNOSIS — S13160A Subluxation of C5/C6 cervical vertebrae, initial encounter: Secondary | ICD-10-CM | POA: Diagnosis not present

## 2016-09-23 DIAGNOSIS — S332XXA Dislocation of sacroiliac and sacrococcygeal joint, initial encounter: Secondary | ICD-10-CM | POA: Diagnosis not present

## 2016-09-23 DIAGNOSIS — M9901 Segmental and somatic dysfunction of cervical region: Secondary | ICD-10-CM | POA: Diagnosis not present

## 2016-10-15 DIAGNOSIS — S332XXA Dislocation of sacroiliac and sacrococcygeal joint, initial encounter: Secondary | ICD-10-CM | POA: Diagnosis not present

## 2016-10-15 DIAGNOSIS — S13160A Subluxation of C5/C6 cervical vertebrae, initial encounter: Secondary | ICD-10-CM | POA: Diagnosis not present

## 2016-10-15 DIAGNOSIS — M9904 Segmental and somatic dysfunction of sacral region: Secondary | ICD-10-CM | POA: Diagnosis not present

## 2016-10-15 DIAGNOSIS — M9901 Segmental and somatic dysfunction of cervical region: Secondary | ICD-10-CM | POA: Diagnosis not present

## 2016-10-27 NOTE — Progress Notes (Signed)
Cardiology Office Note  Date: 10/28/2016   ID: Brelynn, Wheller 1945/08/08, MRN [id number]  PCP: Celene Squibb, MD  Primary Cardiologist: Rozann Lesches, MD   Chief Complaint  Patient presents with  . Cardiac follow-up    History of Present Illness: Nancy Winters is a 71 y.o. female last seen in June 2017. She called to schedule a follow-up visit for today. From a cardiac perspective she is not reporting any chest pain or palpitations. States that she has intermittent mild ankle and foot swelling. This pattern has not changed. She also mentions other symptoms that do not sound cardiac, describing tingling in her hands and feet, also intermittent headaches. She states that she will be having lab work and a visit with Dr. Nevada Crane next week.  Most recent echocardiogram from 2015 is outlined below. She has not had an updated study, history of stress induced cardiomyopathy in the past and nonobstructive CAD.  I reviewed her medications which are outlined below. Current regimen includes aspirin, Lipitor, Cozaar, Lopressor, and as needed nitroglycerin which she has not used.  Past Medical History:  Diagnosis Date  . Coronary atherosclerosis    Mild, nonobstructive August 2015  . Essential hypertension, benign   . Female bladder prolapse   . GERD (gastroesophageal reflux disease)   . Takotsubo syndrome 02/13/2014   Echo 02/13/2014 LVEF 40%    Past Surgical History:  Procedure Laterality Date  . BREAST CYST EXCISION Left 1979  . BREAST SURGERY Left 2011   Ducts removed  . LEFT HEART CATHETERIZATION WITH CORONARY ANGIOGRAM N/A 02/12/2014   Procedure: LEFT HEART CATHETERIZATION WITH CORONARY ANGIOGRAM;  Surgeon: Troy Sine, MD;  Location: [facility] CATH LAB;  Service: Cardiovascular;  Laterality: N/A;  . TONSILLECTOMY    . TUBAL LIGATION      Current Outpatient Prescriptions  Medication Sig Dispense Refill  . aspirin EC 81 MG EC tablet Take 1 tablet (81 mg total) by mouth daily.     Marland Kitchen atorvastatin (LIPITOR) 20 MG tablet Take 20 mg by mouth daily.    . cholecalciferol (VITAMIN D) 1000 UNITS tablet Take 1,000 Units by mouth daily.    . Coenzyme Q10 (CO Q-10) 200 MG CAPS Take 200 mg by mouth at bedtime.    Marland Kitchen esomeprazole (NEXIUM) 40 MG capsule Take 40 mg by mouth daily at 12 noon.    . Glucosamine Sulfate-MSM (GLUCOSAMINE-MSM DS PO) Take 2 tablets by mouth daily.    Marland Kitchen losartan (COZAAR) 25 MG tablet Take 1 tablet (25 mg total) by mouth daily. 90 tablet 3  . Magnesium 250 MG TABS Take 250 mg by mouth daily.    . metoprolol tartrate (LOPRESSOR) 25 MG tablet Take 0.5 tablets (12.5 mg total) by mouth 2 (two) times daily. 90 tablet 3  . Multiple Vitamins-Minerals (MULTIVITAMINS THER. W/MINERALS) TABS tablet Take 1 tablet by mouth daily.    . nitrofurantoin (MACRODANTIN) 50 MG capsule Take 50 mg by mouth at bedtime.    . nitroGLYCERIN (NITROSTAT) 0.4 MG SL tablet PLACE 1 TABLET UNDER THE TONGUE EVERY 5 MINUTES AS NEEDED FOR CHEST PAIN 25 tablet 3  . ranitidine (ZANTAC) 300 MG tablet Take 300 mg by mouth at bedtime.    . vitamin B-12 (CYANOCOBALAMIN) 1000 MCG tablet Take 1,000 mcg by mouth daily.     No current facility-administered medications for this visit.    Allergies:  Ciprofloxacin   Social History: The patient  reports that she has never smoked. She has never  used smokeless tobacco. She reports that she does not drink alcohol or use drugs.   ROS:  Please see the history of present illness. Otherwise, complete review of systems is positive for tingling in her hands and feet.  All other systems are reviewed and negative.   Physical Exam: VS:  BP 102/60   Pulse (!) 58   Ht 5\' 6"  (1.676 m)   Wt 164 lb (74.4 kg)   SpO2 99%   BMI 26.47 kg/m , BMI Body mass index is 26.47 kg/m.  Wt Readings from Last 3 Encounters:  10/28/16 164 lb (74.4 kg)  12/01/15 173 lb (78.5 kg)  09/27/14 166 lb 9.6 oz (75.6 kg)    Overweight woman, appears comfortable at rest. HEENT:  Conjunctiva and lids normal, oropharynx clear. Neck: Supple, no elevated JVP or carotid bruits, no thyromegaly. Lungs: Clear to auscultation, no wheezes or rhonchi, nonlabored breathing at rest. Cardiac: Regular rate and rhythm, no S3 or significant systolic murmur, no pericardial rub. Abdomen: Soft, nontender, bowel sounds present. Extremities: Trace ankle edema, distal pulses 2+. Skin: Warm and dry. Musculoskeletal: No kyphosis. Neuropsychiatric: Alert and oriented 3, affect appropriate.  ECG: I personally reviewed the tracing from 12/01/2015 which showed sinus bradycardia with slight R' in lead V1 and V2.  Recent Labwork:    Component Value Date/Time   CHOL 168 02/12/2014 0215   TRIG 82 02/12/2014 0215   HDL 64 02/12/2014 0215   CHOLHDL 2.6 02/12/2014 0215   VLDL 16 02/12/2014 0215   LDLCALC 88 02/12/2014 0215    Other Studies Reviewed Today:  Echocardiogram 04/25/2014: Study Conclusions  - Left ventricle: The cavity size was normal. Wall thickness was normal. Systolic function was normal. The estimated ejection fraction was in the range of 55% to 60%. Wall motion was normal; there were no regional wall motion abnormalities. Left ventricular diastolic function parameters were normal. - Aortic valve: Mildly calcified annulus. Trileaflet; mildly thickened leaflets. - Mitral valve: Mildly calcified annulus. Normal thickness leaflets. - Atrial septum: No defect or patent foramen ovale was identified. - Technically adequate study.  Cardiac catheterization 02/12/2014: Left main: Angiographically normal short vessel which bifurcated into the LAD and left circumflex coronary artery  LAD: Moderate size vessel that is smooth 20% ostial narrowing.  The vessel ended in an LAD diagonal twin-like system.  The ostial LAD smooth narrowing did not significantly improve with IC nitroglycerin administration  Left circumflex: Angiographically normal vessel.  Right coronary  artery: Angiographically normal vessel.  Left ventriculography revealed features suggestive of possible Takotsubo cardiomyopathy.  Ejection fraction was approximately 35%.  There is vigorous contractility of the basal walls.  There is apical ballooning extending from the mid anterolateral wall, apex to the mid inferior wall.  There is a suggestion of mild mitral valve prolapse  Assessment and Plan:  1. History of Tako-tsubo cardiomyopathy, LVEF normalized by last echocardiogram in 2015. She does not report any recurrent chest pain, has had mild intermittent ankle and foot edema. She is not on a diuretic. Plan is to obtain a follow-up echocardiogram to reassess LVEF. Otherwise no change in medical therapy for now.  2. Intermittent headache, tingling in her hands and feet. Patient states she has lab work and visit pending with Dr. Nevada Crane in the next week.  3. Nonobstructive CAD by cardiac catheterization in 2015. No active angina symptoms. She is on aspirin and statin therapy.  4. Hyperlipidemia, on Lipitor. Pending lab work with Dr. Nevada Crane.  Current medicines were reviewed with the patient  today.   Orders Placed This Encounter  Procedures  . ECHOCARDIOGRAM COMPLETE    Disposition: Follow-up in one year.  Signed, [name], MD, [facility] 10/28/2016 8:29 AM    [place] at [facility] [address], [place], [place] [address] Phone: [phone]; Fax: [phone]

## 2016-10-28 ENCOUNTER — Ambulatory Visit (INDEPENDENT_AMBULATORY_CARE_PROVIDER_SITE_OTHER): Payer: Medicare Other | Admitting: Cardiology

## 2016-10-28 ENCOUNTER — Encounter: Payer: Self-pay | Admitting: Cardiology

## 2016-10-28 VITALS — BP 102/60 | HR 58 | Ht 66.0 in | Wt 164.0 lb

## 2016-10-28 DIAGNOSIS — R51 Headache: Secondary | ICD-10-CM | POA: Diagnosis not present

## 2016-10-28 DIAGNOSIS — I2510 Atherosclerotic heart disease of native coronary artery without angina pectoris: Secondary | ICD-10-CM

## 2016-10-28 DIAGNOSIS — I5181 Takotsubo syndrome: Secondary | ICD-10-CM

## 2016-10-28 DIAGNOSIS — E782 Mixed hyperlipidemia: Secondary | ICD-10-CM | POA: Diagnosis not present

## 2016-10-28 DIAGNOSIS — R519 Headache, unspecified: Secondary | ICD-10-CM

## 2016-10-28 DIAGNOSIS — Z8679 Personal history of other diseases of the circulatory system: Secondary | ICD-10-CM | POA: Diagnosis not present

## 2016-10-28 NOTE — Patient Instructions (Signed)
Medication Instructions:  Your physician recommends that you continue on your current medications as directed. Please refer to the Current Medication list given to you today.  Labwork: NONE  Testing/Procedures: Your physician has requested that you have an echocardiogram. Echocardiography is a painless test that uses sound waves to create images of your heart. It provides your doctor with information about the size and shape of your heart and how well your heart's chambers and valves are working. This procedure takes approximately one hour. There are no restrictions for this procedure.  Follow-Up: Your physician wants you to follow-up in: 1 YEAR WITH DR. MCDOWELL. You will receive a reminder letter in the mail two months in advance. If you don't receive a letter, please call our office to schedule the follow-up appointment.  Any Other Special Instructions Will Be Listed Below (If Applicable).  If you need a refill on your cardiac medications before your next appointment, please call your pharmacy. 

## 2016-10-29 DIAGNOSIS — S332XXA Dislocation of sacroiliac and sacrococcygeal joint, initial encounter: Secondary | ICD-10-CM | POA: Diagnosis not present

## 2016-10-29 DIAGNOSIS — S13160A Subluxation of C5/C6 cervical vertebrae, initial encounter: Secondary | ICD-10-CM | POA: Diagnosis not present

## 2016-10-29 DIAGNOSIS — M9904 Segmental and somatic dysfunction of sacral region: Secondary | ICD-10-CM | POA: Diagnosis not present

## 2016-10-29 DIAGNOSIS — M9901 Segmental and somatic dysfunction of cervical region: Secondary | ICD-10-CM | POA: Diagnosis not present

## 2016-11-01 DIAGNOSIS — I10 Essential (primary) hypertension: Secondary | ICD-10-CM | POA: Diagnosis not present

## 2016-11-03 DIAGNOSIS — Z23 Encounter for immunization: Secondary | ICD-10-CM | POA: Diagnosis not present

## 2016-11-03 DIAGNOSIS — G9009 Other idiopathic peripheral autonomic neuropathy: Secondary | ICD-10-CM | POA: Diagnosis not present

## 2016-11-03 DIAGNOSIS — E785 Hyperlipidemia, unspecified: Secondary | ICD-10-CM | POA: Diagnosis not present

## 2016-11-03 DIAGNOSIS — I10 Essential (primary) hypertension: Secondary | ICD-10-CM | POA: Diagnosis not present

## 2016-11-03 DIAGNOSIS — K219 Gastro-esophageal reflux disease without esophagitis: Secondary | ICD-10-CM | POA: Diagnosis not present

## 2016-11-03 DIAGNOSIS — N390 Urinary tract infection, site not specified: Secondary | ICD-10-CM | POA: Diagnosis not present

## 2016-11-03 DIAGNOSIS — Z6829 Body mass index (BMI) 29.0-29.9, adult: Secondary | ICD-10-CM | POA: Diagnosis not present

## 2016-11-04 ENCOUNTER — Ambulatory Visit (HOSPITAL_COMMUNITY): Payer: Medicare Other

## 2016-11-09 ENCOUNTER — Ambulatory Visit (HOSPITAL_COMMUNITY): Payer: Medicare Other | Attending: Cardiology

## 2016-11-12 DIAGNOSIS — M9904 Segmental and somatic dysfunction of sacral region: Secondary | ICD-10-CM | POA: Diagnosis not present

## 2016-11-12 DIAGNOSIS — S332XXA Dislocation of sacroiliac and sacrococcygeal joint, initial encounter: Secondary | ICD-10-CM | POA: Diagnosis not present

## 2016-11-12 DIAGNOSIS — S13160A Subluxation of C5/C6 cervical vertebrae, initial encounter: Secondary | ICD-10-CM | POA: Diagnosis not present

## 2016-11-12 DIAGNOSIS — M9901 Segmental and somatic dysfunction of cervical region: Secondary | ICD-10-CM | POA: Diagnosis not present

## 2016-11-25 DIAGNOSIS — M9904 Segmental and somatic dysfunction of sacral region: Secondary | ICD-10-CM | POA: Diagnosis not present

## 2016-11-25 DIAGNOSIS — S332XXA Dislocation of sacroiliac and sacrococcygeal joint, initial encounter: Secondary | ICD-10-CM | POA: Diagnosis not present

## 2016-12-06 DIAGNOSIS — S332XXA Dislocation of sacroiliac and sacrococcygeal joint, initial encounter: Secondary | ICD-10-CM | POA: Diagnosis not present

## 2016-12-06 DIAGNOSIS — M9904 Segmental and somatic dysfunction of sacral region: Secondary | ICD-10-CM | POA: Diagnosis not present

## 2016-12-13 DIAGNOSIS — S332XXA Dislocation of sacroiliac and sacrococcygeal joint, initial encounter: Secondary | ICD-10-CM | POA: Diagnosis not present

## 2016-12-13 DIAGNOSIS — M9904 Segmental and somatic dysfunction of sacral region: Secondary | ICD-10-CM | POA: Diagnosis not present

## 2016-12-20 DIAGNOSIS — M9901 Segmental and somatic dysfunction of cervical region: Secondary | ICD-10-CM | POA: Diagnosis not present

## 2016-12-20 DIAGNOSIS — M25511 Pain in right shoulder: Secondary | ICD-10-CM | POA: Diagnosis not present

## 2016-12-20 DIAGNOSIS — S13160A Subluxation of C5/C6 cervical vertebrae, initial encounter: Secondary | ICD-10-CM | POA: Diagnosis not present

## 2016-12-24 DIAGNOSIS — S13160A Subluxation of C5/C6 cervical vertebrae, initial encounter: Secondary | ICD-10-CM | POA: Diagnosis not present

## 2016-12-24 DIAGNOSIS — M9901 Segmental and somatic dysfunction of cervical region: Secondary | ICD-10-CM | POA: Diagnosis not present

## 2016-12-24 DIAGNOSIS — M25511 Pain in right shoulder: Secondary | ICD-10-CM | POA: Diagnosis not present

## 2016-12-31 DIAGNOSIS — M25511 Pain in right shoulder: Secondary | ICD-10-CM | POA: Diagnosis not present

## 2016-12-31 DIAGNOSIS — S13160A Subluxation of C5/C6 cervical vertebrae, initial encounter: Secondary | ICD-10-CM | POA: Diagnosis not present

## 2016-12-31 DIAGNOSIS — M9901 Segmental and somatic dysfunction of cervical region: Secondary | ICD-10-CM | POA: Diagnosis not present

## 2017-01-14 DIAGNOSIS — M9901 Segmental and somatic dysfunction of cervical region: Secondary | ICD-10-CM | POA: Diagnosis not present

## 2017-01-14 DIAGNOSIS — M25511 Pain in right shoulder: Secondary | ICD-10-CM | POA: Diagnosis not present

## 2017-01-14 DIAGNOSIS — S13160A Subluxation of C5/C6 cervical vertebrae, initial encounter: Secondary | ICD-10-CM | POA: Diagnosis not present

## 2017-02-08 DIAGNOSIS — M25511 Pain in right shoulder: Secondary | ICD-10-CM | POA: Diagnosis not present

## 2017-02-08 DIAGNOSIS — M9901 Segmental and somatic dysfunction of cervical region: Secondary | ICD-10-CM | POA: Diagnosis not present

## 2017-02-08 DIAGNOSIS — S13160A Subluxation of C5/C6 cervical vertebrae, initial encounter: Secondary | ICD-10-CM | POA: Diagnosis not present

## 2017-02-14 DIAGNOSIS — M25511 Pain in right shoulder: Secondary | ICD-10-CM | POA: Diagnosis not present

## 2017-02-14 DIAGNOSIS — M9901 Segmental and somatic dysfunction of cervical region: Secondary | ICD-10-CM | POA: Diagnosis not present

## 2017-02-14 DIAGNOSIS — S13160A Subluxation of C5/C6 cervical vertebrae, initial encounter: Secondary | ICD-10-CM | POA: Diagnosis not present

## 2017-03-01 DIAGNOSIS — M25511 Pain in right shoulder: Secondary | ICD-10-CM | POA: Diagnosis not present

## 2017-03-01 DIAGNOSIS — S13160A Subluxation of C5/C6 cervical vertebrae, initial encounter: Secondary | ICD-10-CM | POA: Diagnosis not present

## 2017-03-01 DIAGNOSIS — M9901 Segmental and somatic dysfunction of cervical region: Secondary | ICD-10-CM | POA: Diagnosis not present

## 2017-04-15 DIAGNOSIS — M9901 Segmental and somatic dysfunction of cervical region: Secondary | ICD-10-CM | POA: Diagnosis not present

## 2017-04-15 DIAGNOSIS — S13160A Subluxation of C5/C6 cervical vertebrae, initial encounter: Secondary | ICD-10-CM | POA: Diagnosis not present

## 2017-04-15 DIAGNOSIS — M9904 Segmental and somatic dysfunction of sacral region: Secondary | ICD-10-CM | POA: Diagnosis not present

## 2017-04-15 DIAGNOSIS — S332XXA Dislocation of sacroiliac and sacrococcygeal joint, initial encounter: Secondary | ICD-10-CM | POA: Diagnosis not present

## 2017-05-09 DIAGNOSIS — I10 Essential (primary) hypertension: Secondary | ICD-10-CM | POA: Diagnosis not present

## 2017-05-09 DIAGNOSIS — E785 Hyperlipidemia, unspecified: Secondary | ICD-10-CM | POA: Diagnosis not present

## 2017-05-11 DIAGNOSIS — Z23 Encounter for immunization: Secondary | ICD-10-CM | POA: Diagnosis not present

## 2017-05-11 DIAGNOSIS — E785 Hyperlipidemia, unspecified: Secondary | ICD-10-CM | POA: Diagnosis not present

## 2017-05-11 DIAGNOSIS — K219 Gastro-esophageal reflux disease without esophagitis: Secondary | ICD-10-CM | POA: Diagnosis not present

## 2017-05-11 DIAGNOSIS — Z87440 Personal history of urinary (tract) infections: Secondary | ICD-10-CM | POA: Diagnosis not present

## 2017-07-14 DIAGNOSIS — M8580 Other specified disorders of bone density and structure, unspecified site: Secondary | ICD-10-CM | POA: Diagnosis not present

## 2017-07-14 DIAGNOSIS — Z780 Asymptomatic menopausal state: Secondary | ICD-10-CM | POA: Diagnosis not present

## 2017-07-14 DIAGNOSIS — Z1231 Encounter for screening mammogram for malignant neoplasm of breast: Secondary | ICD-10-CM | POA: Diagnosis not present

## 2017-07-14 DIAGNOSIS — Z1212 Encounter for screening for malignant neoplasm of rectum: Secondary | ICD-10-CM | POA: Diagnosis not present

## 2017-07-14 DIAGNOSIS — N8111 Cystocele, midline: Secondary | ICD-10-CM | POA: Diagnosis not present

## 2017-07-14 DIAGNOSIS — N904 Leukoplakia of vulva: Secondary | ICD-10-CM | POA: Diagnosis not present

## 2017-08-15 DIAGNOSIS — M791 Myalgia, unspecified site: Secondary | ICD-10-CM | POA: Diagnosis not present

## 2017-08-15 DIAGNOSIS — Z6827 Body mass index (BMI) 27.0-27.9, adult: Secondary | ICD-10-CM | POA: Diagnosis not present

## 2017-09-19 ENCOUNTER — Emergency Department (HOSPITAL_COMMUNITY): Payer: Medicare Other

## 2017-09-19 ENCOUNTER — Emergency Department (HOSPITAL_COMMUNITY)
Admission: EM | Admit: 2017-09-19 | Discharge: 2017-09-19 | Disposition: A | Discharge status: Home / Self Care | Payer: Medicare Other | Attending: Emergency Medicine | Admitting: Emergency Medicine

## 2017-09-19 ENCOUNTER — Other Ambulatory Visit: Payer: Self-pay

## 2017-09-19 ENCOUNTER — Encounter (HOSPITAL_COMMUNITY): Payer: Self-pay | Admitting: Emergency Medicine

## 2017-09-19 DIAGNOSIS — I259 Chronic ischemic heart disease, unspecified: Secondary | ICD-10-CM | POA: Insufficient documentation

## 2017-09-19 DIAGNOSIS — R0602 Shortness of breath: Secondary | ICD-10-CM | POA: Insufficient documentation

## 2017-09-19 DIAGNOSIS — Z79899 Other long term (current) drug therapy: Secondary | ICD-10-CM | POA: Diagnosis not present

## 2017-09-19 DIAGNOSIS — I10 Essential (primary) hypertension: Secondary | ICD-10-CM | POA: Diagnosis not present

## 2017-09-19 DIAGNOSIS — Z7982 Long term (current) use of aspirin: Secondary | ICD-10-CM | POA: Insufficient documentation

## 2017-09-19 LAB — CBC WITH DIFFERENTIAL/PLATELET
Basophils Absolute: 0 10*3/uL (ref 0.0–0.1)
Basophils Relative: 0 %
Eosinophils Absolute: 0.2 10*3/uL (ref 0.0–0.7)
Eosinophils Relative: 3 %
HCT: 40.3 % (ref 36.0–46.0)
HEMOGLOBIN: 13.4 g/dL (ref 12.0–15.0)
Lymphocytes Relative: 40 %
Lymphs Abs: 2.3 10*3/uL (ref 0.7–4.0)
MCH: 30.5 pg (ref 26.0–34.0)
MCHC: 33.3 g/dL (ref 30.0–36.0)
MCV: 91.8 fL (ref 78.0–100.0)
Monocytes Absolute: 0.6 10*3/uL (ref 0.1–1.0)
Monocytes Relative: 10 %
NEUTROS PCT: 47 %
Neutro Abs: 2.7 10*3/uL (ref 1.7–7.7)
Platelets: 237 10*3/uL (ref 150–400)
RBC: 4.39 MIL/uL (ref 3.87–5.11)
RDW: 12.8 % (ref 11.5–15.5)
WBC: 5.8 10*3/uL (ref 4.0–10.5)

## 2017-09-19 LAB — BRAIN NATRIURETIC PEPTIDE: B Natriuretic Peptide: 31 pg/mL (ref 0.0–100.0)

## 2017-09-19 LAB — BASIC METABOLIC PANEL
ANION GAP: 10 (ref 5–15)
BUN: 13 mg/dL (ref 6–20)
CALCIUM: 9.5 mg/dL (ref 8.9–10.3)
CO2: 23 mmol/L (ref 22–32)
Chloride: 106 mmol/L (ref 101–111)
Creatinine, Ser: 0.92 mg/dL (ref 0.44–1.00)
GFR calc Af Amer: >60 mL/min (ref >60)
GFR calc non Af Amer: >60 mL/min (ref >60)
GLUCOSE: 111 mg/dL — AB (ref 65–99)
Potassium: 4.1 mmol/L (ref 3.5–5.1)
Sodium: 139 mmol/L (ref 135–145)

## 2017-09-19 LAB — TROPONIN I: Troponin I: <0.03 ng/mL (ref <0.03)

## 2017-09-19 MED ORDER — ALBUTEROL SULFATE (2.5 MG/3ML) 0.083% IN NEBU: 5.0000 mg | INHALATION_SOLUTION | Freq: Once | RESPIRATORY_TRACT | Status: DC

## 2017-09-19 NOTE — Discharge Instructions (Addendum)
Follow-up with your doctor if any more problems.

## 2017-09-19 NOTE — ED Notes (Signed)
Updated pt on wait time. Pt verbalized understanding.  

## 2017-09-19 NOTE — ED Triage Notes (Signed)
Pt a/o. Pt states is losing her voice and gets sob when she exerts herself. X 4 days. Denies pain/swelling/cough

## 2017-09-19 NOTE — ED Provider Notes (Signed)
[facility] EMERGENCY DEPARTMENT Provider Note   CSN: [id number] Arrival date & time: 09/19/17  1450     History   Chief Complaint Chief Complaint  Patient presents with  . Shortness of Breath    HPI Nancy Winters is a 72 y.o. female.  Patient states that she had some shortness of breath and difficulty speaking.  She did not complain of any chest pain.  This is now resolved.  The history is provided by the patient. No language interpreter was used.  Shortness of Breath  This is a new problem. The problem occurs rarely.The current episode started 1 to 2 hours ago. The problem has been resolved. Pertinent negatives include no fever, no headaches, no cough, no chest pain, no abdominal pain and no rash. Precipitated by: Unknown. Risk factors: None. She has tried nothing for the symptoms. The treatment provided significant relief. She has had prior hospitalizations. Associated medical issues do not include asthma.    Past Medical History:  Diagnosis Date  . Coronary atherosclerosis    Mild, nonobstructive August 2015  . Essential hypertension, benign   . Female bladder prolapse   . GERD (gastroesophageal reflux disease)   . Takotsubo syndrome 02/13/2014   Echo 02/13/2014 LVEF 40%    Patient Active Problem List   Diagnosis Date Noted  . Takotsubo syndrome 02/13/2014  . Coronary atherosclerosis of native coronary artery 02/13/2014  . NICM (nonischemic cardiomyopathy) EF 35% 02/13/2014  . GERD (gastroesophageal reflux disease) 05/14/2013  . Precordial pain 05/14/2013  . History of TIA (transient ischemic attack) 05/14/2013    Past Surgical History:  Procedure Laterality Date  . BREAST CYST EXCISION Left 1979  . BREAST SURGERY Left 2011   Ducts removed  . LEFT HEART CATHETERIZATION WITH CORONARY ANGIOGRAM N/A 02/12/2014   Procedure: LEFT HEART CATHETERIZATION WITH CORONARY ANGIOGRAM;  Surgeon: Troy Sine, MD;  Location: [facility] CATH LAB;  Service: Cardiovascular;   Laterality: N/A;  . TONSILLECTOMY    . TUBAL LIGATION       OB History   None      Home Medications    Prior to Admission medications   Medication Sig Start Date End Date Taking? Authorizing Provider  aspirin EC 81 MG EC tablet Take 1 tablet (81 mg total) by mouth daily. 02/14/14  Yes Almyra Deforest, PA  esomeprazole (NEXIUM) 40 MG capsule Take 40 mg by mouth daily at 12 noon.   Yes [provider]  losartan (COZAAR) 25 MG tablet Take 1 tablet (25 mg total) by mouth daily. 03/18/14  Yes Lendon Colonel, NP  metoprolol tartrate (LOPRESSOR) 25 MG tablet Take 0.5 tablets (12.5 mg total) by mouth 2 (two) times daily. Patient taking differently: Take 25 mg by mouth 2 (two) times daily.  12/01/15  Yes [name], MD  Multiple Vitamins-Minerals (MULTIVITAMINS THER. W/MINERALS) TABS tablet Take 1 tablet by mouth daily.   Yes [provider]  nitrofurantoin (MACRODANTIN) 50 MG capsule Take 50 mg by mouth at bedtime.   Yes [provider]  nitroGLYCERIN (NITROSTAT) 0.4 MG SL tablet PLACE 1 TABLET UNDER THE TONGUE EVERY 5 MINUTES AS NEEDED FOR CHEST PAIN 07/08/15  Yes Lendon Colonel, NP  ranitidine (ZANTAC) 300 MG tablet Take 300 mg by mouth at bedtime.   Yes [provider]    Family History Family History  Problem Relation Age of Onset  . Cancer Mother   . Cancer Father   . HIV/AIDS Brother     Social  History Social History   Tobacco Use  . Smoking status: Never Smoker  . Smokeless tobacco: Never Used  Substance Use Topics  . Alcohol use: No    Alcohol/week: 0.0 oz  . Drug use: No     Allergies   Ciprofloxacin   Review of Systems Review of Systems  Constitutional: Negative for appetite change, fatigue and fever.  HENT: Negative for congestion, ear discharge and sinus pressure.   Eyes: Negative for discharge.  Respiratory: Positive for shortness of breath. Negative for cough.   Cardiovascular: Negative for chest pain.    Gastrointestinal: Negative for abdominal pain and diarrhea.  Genitourinary: Negative for frequency and hematuria.  Musculoskeletal: Negative for back pain.  Skin: Negative for rash.  Neurological: Negative for seizures and headaches.  Psychiatric/Behavioral: Negative for hallucinations.     Physical Exam Updated Vital Signs BP 136/74 (BP Location: Left Arm)   Pulse (!) 53   Temp (!) 97.5 F (36.4 C) (Oral)   Resp 16   Ht 5\' 6"  (1.676 m)   Wt 77.1 kg (170 lb)   SpO2 100%   BMI 27.44 kg/m   Physical Exam  Constitutional: She is oriented to person, place, and time. She appears well-developed.  HENT:  Head: Normocephalic.  Eyes: Conjunctivae and EOM are normal. No scleral icterus.  Neck: Neck supple. No thyromegaly present.  Cardiovascular: Normal rate and regular rhythm. Exam reveals no gallop and no friction rub.  No murmur heard. Pulmonary/Chest: No stridor. She has no wheezes. She has no rales. She exhibits no tenderness.  Abdominal: She exhibits no distension. There is no tenderness. There is no rebound.  Musculoskeletal: Normal range of motion. She exhibits no edema.  Lymphadenopathy:    She has no cervical adenopathy.  Neurological: She is oriented to person, place, and time. She exhibits normal muscle tone. Coordination normal.  Skin: No rash noted. No erythema.  Psychiatric: She has a normal mood and affect. Her behavior is normal.     ED Treatments / Results  Labs (all labs ordered are listed, but only abnormal results are displayed) Labs Reviewed  BASIC METABOLIC PANEL - Abnormal; Notable for the following components:      Result Value   Glucose, Bld 111 (*)    All other components within normal limits  CBC WITH DIFFERENTIAL/PLATELET  TROPONIN I  BRAIN NATRIURETIC PEPTIDE    EKG EKG Interpretation  Date/Time:  Monday September 19 2017 14:58:03 EDT Ventricular Rate:  61 PR Interval:  206 QRS Duration: 64 QT Interval:  426 QTC Calculation: 428 R  Axis:   86 Text Interpretation:  Normal sinus rhythm Septal infarct , age undetermined Abnormal ECG Confirmed by Milton Ferguson [phone]) on 09/19/2017 7:29:19 PM Also confirmed by Milton Ferguson [phone])  on 09/19/2017 7:39:57 PM   Radiology Dg Chest 2 View  Result Date: 09/19/2017 CLINICAL DATA:  Increasing shortness of breath and chest pressure since 09/15/2017. EXAM: CHEST - 2 VIEW COMPARISON:  PA and lateral chest 04/17/2014. FINDINGS: Small focus of scar in the periphery of the right upper lobe and biapical pleuroparenchymal scarring are unchanged. The lungs are otherwise clear. Heart size is normal. No pneumothorax or pleural effusion. No acute bony abnormality. IMPRESSION: No acute disease. Electronically Signed   By: Inge Rise M.D.   On: 09/19/2017 15:28    Procedures Procedures (including critical care time)  Medications Ordered in ED Medications - No data to display   Initial Impression / Assessment and Plan / ED Course  I have  reviewed the triage vital signs and the nursing notes.  Pertinent labs & imaging results that were available during my care of the patient were reviewed by me and considered in my medical decision making (see chart for details).     Patient with shortness of breath that has resolved.  Patient has had CBC chemistries troponin BNP all unremarkable.  Chest x-ray unremarkable EKG shows no acute changes.  Patient is now back to his normal.  She will be discharged home for follow-up with her MD  Final Clinical Impressions(s) / ED Diagnoses   Final diagnoses:  SOB (shortness of breath)    ED Discharge Orders    None       Milton Ferguson, MD 09/19/17 2058

## 2017-09-19 NOTE — ED Notes (Signed)
Pt verbalized understanding. Signature pad is not working in room

## 2017-09-19 NOTE — ED Notes (Signed)
Gave EKG to Dr. Zammit 

## 2017-09-22 DIAGNOSIS — I252 Old myocardial infarction: Secondary | ICD-10-CM | POA: Diagnosis not present

## 2017-09-22 DIAGNOSIS — R0602 Shortness of breath: Secondary | ICD-10-CM | POA: Diagnosis not present

## 2017-09-22 DIAGNOSIS — O09A0 Supervision of pregnancy with history of molar pregnancy, unspecified trimester: Secondary | ICD-10-CM | POA: Diagnosis not present

## 2017-09-22 DIAGNOSIS — Z23 Encounter for immunization: Secondary | ICD-10-CM | POA: Diagnosis not present

## 2017-09-22 DIAGNOSIS — M791 Myalgia, unspecified site: Secondary | ICD-10-CM | POA: Diagnosis not present

## 2017-09-22 DIAGNOSIS — Z87440 Personal history of urinary (tract) infections: Secondary | ICD-10-CM | POA: Diagnosis not present

## 2017-09-22 DIAGNOSIS — K219 Gastro-esophageal reflux disease without esophagitis: Secondary | ICD-10-CM | POA: Diagnosis not present

## 2017-09-22 DIAGNOSIS — Z6827 Body mass index (BMI) 27.0-27.9, adult: Secondary | ICD-10-CM | POA: Diagnosis not present

## 2017-09-22 DIAGNOSIS — E785 Hyperlipidemia, unspecified: Secondary | ICD-10-CM | POA: Diagnosis not present

## 2017-09-22 DIAGNOSIS — J309 Allergic rhinitis, unspecified: Secondary | ICD-10-CM | POA: Diagnosis not present

## 2017-11-07 DIAGNOSIS — E785 Hyperlipidemia, unspecified: Secondary | ICD-10-CM | POA: Diagnosis not present

## 2017-11-08 DIAGNOSIS — R0609 Other forms of dyspnea: Secondary | ICD-10-CM | POA: Diagnosis not present

## 2017-11-08 DIAGNOSIS — R0600 Dyspnea, unspecified: Secondary | ICD-10-CM | POA: Diagnosis not present

## 2017-11-08 DIAGNOSIS — J984 Other disorders of lung: Secondary | ICD-10-CM | POA: Diagnosis not present

## 2017-11-08 DIAGNOSIS — J9801 Acute bronchospasm: Secondary | ICD-10-CM | POA: Diagnosis not present

## 2017-11-08 DIAGNOSIS — I5181 Takotsubo syndrome: Secondary | ICD-10-CM | POA: Diagnosis not present

## 2017-11-09 DIAGNOSIS — N390 Urinary tract infection, site not specified: Secondary | ICD-10-CM | POA: Diagnosis not present

## 2017-11-09 DIAGNOSIS — K219 Gastro-esophageal reflux disease without esophagitis: Secondary | ICD-10-CM | POA: Diagnosis not present

## 2017-11-09 DIAGNOSIS — I2510 Atherosclerotic heart disease of native coronary artery without angina pectoris: Secondary | ICD-10-CM | POA: Diagnosis not present

## 2017-11-09 DIAGNOSIS — E782 Mixed hyperlipidemia: Secondary | ICD-10-CM | POA: Diagnosis not present

## 2017-11-09 DIAGNOSIS — I10 Essential (primary) hypertension: Secondary | ICD-10-CM | POA: Diagnosis not present

## 2017-11-09 DIAGNOSIS — Z6827 Body mass index (BMI) 27.0-27.9, adult: Secondary | ICD-10-CM | POA: Diagnosis not present

## 2017-11-29 DIAGNOSIS — H2513 Age-related nuclear cataract, bilateral: Secondary | ICD-10-CM | POA: Diagnosis not present

## 2017-11-29 DIAGNOSIS — H43813 Vitreous degeneration, bilateral: Secondary | ICD-10-CM | POA: Diagnosis not present

## 2017-11-29 DIAGNOSIS — H1013 Acute atopic conjunctivitis, bilateral: Secondary | ICD-10-CM | POA: Diagnosis not present

## 2017-12-12 DIAGNOSIS — J45909 Unspecified asthma, uncomplicated: Secondary | ICD-10-CM | POA: Diagnosis not present

## 2018-01-19 ENCOUNTER — Other Ambulatory Visit: Payer: Self-pay

## 2018-03-03 DIAGNOSIS — J309 Allergic rhinitis, unspecified: Secondary | ICD-10-CM | POA: Diagnosis not present

## 2018-03-03 DIAGNOSIS — I252 Old myocardial infarction: Secondary | ICD-10-CM | POA: Diagnosis not present

## 2018-03-03 DIAGNOSIS — R0602 Shortness of breath: Secondary | ICD-10-CM | POA: Diagnosis not present

## 2018-03-03 DIAGNOSIS — Z87440 Personal history of urinary (tract) infections: Secondary | ICD-10-CM | POA: Diagnosis not present

## 2018-03-03 DIAGNOSIS — Z23 Encounter for immunization: Secondary | ICD-10-CM | POA: Diagnosis not present

## 2018-03-03 DIAGNOSIS — Z6827 Body mass index (BMI) 27.0-27.9, adult: Secondary | ICD-10-CM | POA: Diagnosis not present

## 2018-03-03 DIAGNOSIS — M791 Myalgia, unspecified site: Secondary | ICD-10-CM | POA: Diagnosis not present

## 2018-03-03 DIAGNOSIS — K219 Gastro-esophageal reflux disease without esophagitis: Secondary | ICD-10-CM | POA: Diagnosis not present

## 2018-03-03 DIAGNOSIS — E785 Hyperlipidemia, unspecified: Secondary | ICD-10-CM | POA: Diagnosis not present

## 2018-03-06 DIAGNOSIS — Z23 Encounter for immunization: Secondary | ICD-10-CM | POA: Diagnosis not present

## 2018-03-06 DIAGNOSIS — J689 Unspecified respiratory condition due to chemicals, gases, fumes and vapors: Secondary | ICD-10-CM | POA: Diagnosis not present

## 2018-03-06 DIAGNOSIS — Z0001 Encounter for general adult medical examination with abnormal findings: Secondary | ICD-10-CM | POA: Diagnosis not present

## 2018-03-06 DIAGNOSIS — Z6828 Body mass index (BMI) 28.0-28.9, adult: Secondary | ICD-10-CM | POA: Diagnosis not present

## 2018-03-06 DIAGNOSIS — E782 Mixed hyperlipidemia: Secondary | ICD-10-CM | POA: Diagnosis not present

## 2018-03-06 DIAGNOSIS — K219 Gastro-esophageal reflux disease without esophagitis: Secondary | ICD-10-CM | POA: Diagnosis not present

## 2018-03-06 DIAGNOSIS — Z87440 Personal history of urinary (tract) infections: Secondary | ICD-10-CM | POA: Diagnosis not present

## 2018-03-06 DIAGNOSIS — R0602 Shortness of breath: Secondary | ICD-10-CM | POA: Diagnosis not present

## 2018-03-06 DIAGNOSIS — I2510 Atherosclerotic heart disease of native coronary artery without angina pectoris: Secondary | ICD-10-CM | POA: Diagnosis not present

## 2018-03-06 DIAGNOSIS — I10 Essential (primary) hypertension: Secondary | ICD-10-CM | POA: Diagnosis not present

## 2018-05-08 ENCOUNTER — Other Ambulatory Visit: Payer: Self-pay

## 2018-05-08 DIAGNOSIS — H5319 Other subjective visual disturbances: Secondary | ICD-10-CM | POA: Diagnosis not present

## 2018-05-08 DIAGNOSIS — H25813 Combined forms of age-related cataract, bilateral: Secondary | ICD-10-CM | POA: Diagnosis not present

## 2018-05-08 DIAGNOSIS — H40013 Open angle with borderline findings, low risk, bilateral: Secondary | ICD-10-CM | POA: Diagnosis not present

## 2018-05-08 DIAGNOSIS — H43813 Vitreous degeneration, bilateral: Secondary | ICD-10-CM | POA: Diagnosis not present

## 2018-05-30 DIAGNOSIS — H2513 Age-related nuclear cataract, bilateral: Secondary | ICD-10-CM | POA: Diagnosis not present

## 2018-08-14 DIAGNOSIS — Z124 Encounter for screening for malignant neoplasm of cervix: Secondary | ICD-10-CM | POA: Diagnosis not present

## 2018-08-14 DIAGNOSIS — N904 Leukoplakia of vulva: Secondary | ICD-10-CM | POA: Diagnosis not present

## 2018-08-14 DIAGNOSIS — Z1231 Encounter for screening mammogram for malignant neoplasm of breast: Secondary | ICD-10-CM | POA: Diagnosis not present

## 2018-08-14 DIAGNOSIS — M8580 Other specified disorders of bone density and structure, unspecified site: Secondary | ICD-10-CM | POA: Diagnosis not present

## 2018-08-14 DIAGNOSIS — Z780 Asymptomatic menopausal state: Secondary | ICD-10-CM | POA: Diagnosis not present

## 2018-08-14 DIAGNOSIS — Z1212 Encounter for screening for malignant neoplasm of rectum: Secondary | ICD-10-CM | POA: Diagnosis not present

## 2018-08-14 DIAGNOSIS — N8111 Cystocele, midline: Secondary | ICD-10-CM | POA: Diagnosis not present

## 2018-09-27 DIAGNOSIS — I2510 Atherosclerotic heart disease of native coronary artery without angina pectoris: Secondary | ICD-10-CM | POA: Diagnosis not present

## 2018-09-27 DIAGNOSIS — E782 Mixed hyperlipidemia: Secondary | ICD-10-CM | POA: Diagnosis not present

## 2018-09-27 DIAGNOSIS — K219 Gastro-esophageal reflux disease without esophagitis: Secondary | ICD-10-CM | POA: Diagnosis not present

## 2018-09-27 DIAGNOSIS — N390 Urinary tract infection, site not specified: Secondary | ICD-10-CM | POA: Diagnosis not present

## 2018-09-27 DIAGNOSIS — I10 Essential (primary) hypertension: Secondary | ICD-10-CM | POA: Diagnosis not present

## 2018-11-17 ENCOUNTER — Telehealth: Payer: Self-pay | Admitting: Cardiology

## 2018-11-17 NOTE — Telephone Encounter (Signed)
Virtual Visit Pre-Appointment Phone Call  "(Name), I am calling you today to discuss your upcoming appointment. We are currently trying to limit exposure to the virus that causes COVID-19 by seeing patients at home rather than in the office."  1. "What is the BEST phone number to call the day of the visit?" [phone]  2. Do you have or have access to (through a family member/friend) a smartphone with video capability that we can use for your visit?" a. If yes - list this number in appt notes as cell (if different from BEST phone #) and list the appointment type as a VIDEO visit in appointment notes b. If no - list the appointment type as a PHONE visit in appointment notes  3. Confirm consent - "In the setting of the current Covid19 crisis, you are scheduled for a (phone or video) visit with your provider on (date) at (time).  Just as we do with many in-office visits, in order for you to participate in this visit, we must obtain consent.  If you'd like, I can send this to your mychart (if signed up) or email for you to review.  Otherwise, I can obtain your verbal consent now.  All virtual visits are billed to your insurance company just like a normal visit would be.  By agreeing to a virtual visit, we'd like you to understand that the technology does not allow for your provider to perform an examination, and thus may limit your provider's ability to fully assess your condition. If your provider identifies any concerns that need to be evaluated in person, we will make arrangements to do so.  Finally, though the technology is pretty good, we cannot assure that it will always work on either your or our end, and in the setting of a video visit, we may have to convert it to a phone-only visit.  In either situation, we cannot ensure that we have a secure connection.  Are you willing to proceed?" STAFF: Did the patient verbally acknowledge consent to telehealth visit? Document YES/NO here: yes    4. Advise patient to be prepared - "Two hours prior to your appointment, go ahead and check your blood pressure, pulse, oxygen saturation, and your weight (if you have the equipment to check those) and write them all down. When your visit starts, your provider will ask you for this information. If you have an Apple Watch or Kardia device, please plan to have heart rate information ready on the day of your appointment. Please have a pen and paper handy nearby the day of the visit as well."  5. Give patient instructions for MyChart download to smartphone OR Doximity/Doxy.me as below if video visit (depending on what platform provider is using)  6. Inform patient they will receive a phone call 15 minutes prior to their appointment time (may be from unknown caller ID) so they should be prepared to answer    TELEPHONE CALL NOTE  Nancy Winters has been deemed a candidate for a follow-up tele-health visit to limit community exposure during the Covid-19 pandemic. I spoke with the patient via phone to ensure availability of phone/video source, confirm preferred email & phone number, and discuss instructions and expectations.  I reminded Nancy Winters to be prepared with any vital sign and/or heart rhythm information that could potentially be obtained via home monitoring, at the time of her visit. I reminded Nancy Winters to expect a phone call prior to her visit.  Vicky  T Slaughter 11/17/2018 3:26 PM   INSTRUCTIONS FOR DOWNLOADING THE MYCHART APP TO SMARTPHONE  - The patient must first make sure to have activated MyChart and know their login information - If Apple, go to [employer] and type in MyChart in the search bar and download the app. If Android, ask patient to go to [employer] and type in Dotsero in the search bar and download the app. The app is free but as with any other app downloads, their phone may require them to verify saved payment information or Apple/Android password.   - The patient will need to then log into the app with their MyChart username and password, and select [facility] as their healthcare provider to link the account. When it is time for your visit, go to the MyChart app, find appointments, and click Begin Video Visit. Be sure to Select Allow for your device to access the Microphone and Camera for your visit. You will then be connected, and your provider will be with you shortly.  **If they have any issues connecting, or need assistance please contact MyChart service desk [phone])**  **If using a computer, in order to ensure the best quality for their visit they will need to use either of the following Internet Browsers: [employer], or Google Chrome**  IF USING DOXIMITY or DOXY.ME - The patient will receive a link just prior to their visit by text.     FULL LENGTH CONSENT FOR TELE-HEALTH VISIT   I hereby voluntarily request, consent and authorize [facility] and its employed or contracted physicians, physician assistants, nurse practitioners or other licensed health care professionals (the Practitioner), to provide me with telemedicine health care services (the Services") as deemed necessary by the treating Practitioner. I acknowledge and consent to receive the Services by the Practitioner via telemedicine. I understand that the telemedicine visit will involve communicating with the Practitioner through live audiovisual communication technology and the disclosure of certain medical information by electronic transmission. I acknowledge that I have been given the opportunity to request an in-person assessment or other available alternative prior to the telemedicine visit and am voluntarily participating in the telemedicine visit.  I understand that I have the right to withhold or withdraw my consent to the use of telemedicine in the course of my care at any time, without affecting my right to future care or treatment, and that  the Practitioner or I may terminate the telemedicine visit at any time. I understand that I have the right to inspect all information obtained and/or recorded in the course of the telemedicine visit and may receive copies of available information for a reasonable fee.  I understand that some of the potential risks of receiving the Services via telemedicine include:   Delay or interruption in medical evaluation due to technological equipment failure or disruption;  Information transmitted may not be sufficient (e.g. poor resolution of images) to allow for appropriate medical decision making by the Practitioner; and/or   In rare instances, security protocols could fail, causing a breach of personal health information.  Furthermore, I acknowledge that it is my responsibility to provide information about my medical history, conditions and care that is complete and accurate to the best of my ability. I acknowledge that Practitioner's advice, recommendations, and/or decision may be based on factors not within their control, such as incomplete or inaccurate data provided by me or distortions of diagnostic images or specimens that may result from electronic transmissions. I understand that the practice  of medicine is not an Chief Strategy Officer and that Practitioner makes no warranties or guarantees regarding treatment outcomes. I acknowledge that I will receive a copy of this consent concurrently upon execution via email to the email address I last provided but may also request a printed copy by calling the office of [facility].    I understand that my insurance will be billed for this visit.   I have read or had this consent read to me.  I understand the contents of this consent, which adequately explains the benefits and risks of the Services being provided via telemedicine.   I have been provided ample opportunity to ask questions regarding this consent and the Services and have had my questions answered to  my satisfaction.  I give my informed consent for the services to be provided through the use of telemedicine in my medical care  By participating in this telemedicine visit I agree to the above.

## 2018-11-19 NOTE — Progress Notes (Signed)
Virtual Visit via Video Note   This visit type was conducted due to national recommendations for restrictions regarding the COVID-19 Pandemic (e.g. social distancing) in an effort to limit this patient's exposure and mitigate transmission in our community.  Due to her co-morbid illnesses, this patient is at least at moderate risk for complications without adequate follow up.  This format is felt to be most appropriate for this patient at this time.  All issues noted in this document were discussed and addressed.  A limited physical exam was performed with this format.  Please refer to the patient's chart for her consent to telehealth for [facility].   Date:  11/20/2018   ID:  Nancy, Winters 02-23-1946, MRN [id number]  Patient Location: Home Provider Location: Office  PCP:  Celene Squibb, MD  Cardiologist:  Rozann Lesches, MD Electrophysiologist:  None   Evaluation Performed:  Follow-Up Visit  Chief Complaint:  Cardiac follow-up  History of Present Illness:    Nancy Winters is a 73 y.o. female not seen since May 2018.  We communicated via video conferencing today.  She tells me that overall she has been doing reasonably well.  She has been social distancing, not exercising and does feel somewhat more short of breath with activity although attributes this to a decline in her stamina.  We talked about a walking plan.  I reviewed her medications which are outlined below.  She states that she has been compliant.  Today's blood pressure was elevated, she has not been checking it with any regularity however.  She is due to see her PCP and have lab work.  Last echocardiogram was in 2015 as outlined below.  Discussed obtaining an updated study.  The patient does not have symptoms concerning for COVID-19 infection (fever, chills, cough, or new shortness of breath).    Past Medical History:  Diagnosis Date  . Coronary atherosclerosis    Mild, nonobstructive August 2015  .  Essential hypertension, benign   . Female bladder prolapse   . GERD (gastroesophageal reflux disease)   . Takotsubo syndrome 02/13/2014   Echo 02/13/2014 LVEF 40%   Past Surgical History:  Procedure Laterality Date  . BREAST CYST EXCISION Left 1979  . BREAST SURGERY Left 2011   Ducts removed  . LEFT HEART CATHETERIZATION WITH CORONARY ANGIOGRAM N/A 02/12/2014   Procedure: LEFT HEART CATHETERIZATION WITH CORONARY ANGIOGRAM;  Surgeon: Troy Sine, MD;  Location: [facility] CATH [facility];  Service: Cardiovascular;  Laterality: N/A;  . TONSILLECTOMY    . TUBAL LIGATION       Current Meds  Medication Sig  . aspirin EC 81 MG EC tablet Take 1 tablet (81 mg total) by mouth daily.  Marland Kitchen esomeprazole (NEXIUM) 40 MG capsule Take 40 mg by mouth daily at 12 noon.  Marland Kitchen levocetirizine (XYZAL) 5 MG tablet Take 5 mg by mouth every evening.  Marland Kitchen losartan (COZAAR) 25 MG tablet Take 1 tablet (25 mg total) by mouth daily.  . metoprolol tartrate (LOPRESSOR) 50 MG tablet Take 1 tablet by mouth 2 (two) times a day.  . Multiple Vitamins-Minerals (MULTIVITAMINS THER. W/MINERALS) TABS tablet Take 1 tablet by mouth daily.  . nitrofurantoin (MACRODANTIN) 50 MG capsule Take 50 mg by mouth at bedtime.  . nitroGLYCERIN (NITROSTAT) 0.4 MG SL tablet PLACE 1 TABLET UNDER THE TONGUE EVERY 5 MINUTES AS NEEDED FOR CHEST PAIN     Allergies:   Ciprofloxacin   Social History   Tobacco Use  . Smoking  status: Never Smoker  . Smokeless tobacco: Never Used  Substance Use Topics  . Alcohol use: No    Alcohol/week: 0.0 standard drinks  . Drug use: No     Family Hx: The patient's family history includes Cancer in her father and mother; HIV/AIDS in her brother.  ROS:   Please see the history of present illness.    All other systems reviewed and are negative.   Prior CV studies:   The following studies were reviewed today:  Echocardiogram 04/25/2014: Study Conclusions  - Left ventricle: The cavity size was normal. Wall thickness  was normal. Systolic function was normal. The estimated ejection fraction was in the range of 55% to 60%. Wall motion was normal; there were no regional wall motion abnormalities. Left ventricular diastolic function parameters were normal. - Aortic valve: Mildly calcified annulus. Trileaflet; mildly thickened leaflets. - Mitral valve: Mildly calcified annulus. Normal thickness leaflets. - Atrial septum: No defect or patent foramen ovale was identified. - Technically adequate study.  Cardiac catheterization 02/12/2014: Left main: Angiographically normal short vessel which bifurcated into the LAD and left circumflex coronary artery  LAD: Moderate size vessel that is smooth 20% ostial narrowing. The vessel ended in an LAD diagonal twin-like system. The ostial LAD smooth narrowing did not significantly improve with IC nitroglycerin administration  Left circumflex: Angiographically normal vessel.  Right coronary artery: Angiographically normal vessel.  Left ventriculography revealed features suggestive of possible Takotsubo cardiomyopathy. Ejection fraction was approximately 35%. There is vigorous contractility of the basal walls. There is apical ballooning extending from the mid anterolateral wall, apex to the mid inferior wall. There is a suggestion of mild mitral valve prolapse  Chest x-ray 09/19/2017: FINDINGS: Small focus of scar in the periphery of the right upper lobe and biapical pleuroparenchymal scarring are unchanged. The lungs are otherwise clear. Heart size is normal. No pneumothorax or pleural effusion. No acute bony abnormality.  IMPRESSION: No acute disease.  Labs/Other Tests and Data Reviewed:    EKG:  An ECG dated 09/19/2017 was personally reviewed today and demonstrated:  Sinus rhythm with anteroseptal Q waves.  Recent Labs:  April 2019: Hgb 13.4, platelets 237, potassium 4.1, BUN 13, creatinine 0.92, troponin I <0.03  Wt Readings from Last 3  Encounters:  09/19/17 170 lb (77.1 kg)  10/28/16 164 lb (74.4 kg)  12/01/15 173 lb (78.5 kg)     Objective:    Vital Signs:  BP (!) 160/86   Ht 5\' 6"  (1.676 m)   BMI 27.44 kg/m    General: Patient appears comfortable at rest, seated in her home. HEENT: Conjunctiva and lids normal. Lungs: Patient spoke in full sentences, not short of breath.  No audible wheezing or cough. Skin: Normal appearance of color and turgor. Neuropsychiatric: Gaze conjugate.  Speech pattern normal.  Patient moves all extremities.  Affect is appropriate.  ASSESSMENT & PLAN:    1.  History of stress-induced cardiomyopathy with normalization of LVEF as of 2015.  She has had some dyspnea on exertion, but this may be due to lack of exercise and decreased stamina.  We will obtain a follow-up echocardiogram to ensure stability in LVEF.  No changes made to medications today.  2.  Elevated blood pressure.  Patient has not been checking it regularly and will do so with with follow-up per PCP.  No change in Cozaar Lopressor, these could be adjusted as needed.  Also discussed walking plan.  3.  History of nonobstructive CAD in 2015.  Continue aspirin.  4.  History of hyperlipidemia, previously on Lipitor.  She needs to follow-up with Dr. Nevada Crane for repeat lab work and medication adjustments as needed.  COVID-19 Education: The signs and symptoms of COVID-19 were discussed with the patient and how to seek care for testing (follow up with PCP or arrange E-visit).  =The importance of social distancing was discussed today.  Time:   Today, I have spent 7 minutes with the patient with telehealth technology discussing the above problems.     Medication Adjustments/Labs and Tests Ordered: Current medicines are reviewed at length with the patient today.  Concerns regarding medicines are outlined above.   Tests Ordered: Orders Placed This Encounter  Procedures  . ECHOCARDIOGRAM COMPLETE    Medication Changes: No orders of  the defined types were placed in this encounter.   Disposition:  Follow up test results and determine follow-up plan.  Signed, Rozann Lesches, MD  11/20/2018 9:23 AM    [place]

## 2018-11-20 ENCOUNTER — Telehealth (INDEPENDENT_AMBULATORY_CARE_PROVIDER_SITE_OTHER): Payer: Medicare Other | Admitting: Cardiology

## 2018-11-20 ENCOUNTER — Telehealth: Payer: Self-pay | Admitting: Cardiology

## 2018-11-20 ENCOUNTER — Encounter: Payer: Self-pay | Admitting: Cardiology

## 2018-11-20 VITALS — BP 160/86 | Ht 66.0 in

## 2018-11-20 DIAGNOSIS — I5181 Takotsubo syndrome: Secondary | ICD-10-CM | POA: Diagnosis not present

## 2018-11-20 DIAGNOSIS — I428 Other cardiomyopathies: Secondary | ICD-10-CM

## 2018-11-20 DIAGNOSIS — R030 Elevated blood-pressure reading, without diagnosis of hypertension: Secondary | ICD-10-CM

## 2018-11-20 DIAGNOSIS — E782 Mixed hyperlipidemia: Secondary | ICD-10-CM

## 2018-11-20 DIAGNOSIS — Z7189 Other specified counseling: Secondary | ICD-10-CM | POA: Diagnosis not present

## 2018-11-20 DIAGNOSIS — I2510 Atherosclerotic heart disease of native coronary artery without angina pectoris: Secondary | ICD-10-CM

## 2018-11-20 NOTE — Patient Instructions (Addendum)
Medication Instructions:   Your physician recommends that you continue on your current medications as directed. Please refer to the Current Medication list given to you today.  Labwork:  NONE  Testing/Procedures: Your physician has requested that you have an echocardiogram. Echocardiography is a painless test that uses sound waves to create images of your heart. It provides your doctor with information about the size and shape of your heart and how well your heart's chambers and valves are working. This procedure takes approximately one hour. There are no restrictions for this procedure.  Follow-Up:  Your physician recommends that you schedule a follow-up appointment in: pending test result.  Any Other Special Instructions Will Be Listed Below (If Applicable).  Please monitor your blood pressure at home and communicate these results to your family doctor.  If you need a refill on your cardiac medications before your next appointment, please call your pharmacy.

## 2018-11-20 NOTE — Telephone Encounter (Signed)
Pre-cert Verification for the following procedure   ECHO scheduled for 11/23/2018

## 2018-11-23 ENCOUNTER — Ambulatory Visit (INDEPENDENT_AMBULATORY_CARE_PROVIDER_SITE_OTHER): Payer: Medicare Other

## 2018-11-23 DIAGNOSIS — I428 Other cardiomyopathies: Secondary | ICD-10-CM

## 2019-03-28 DIAGNOSIS — E782 Mixed hyperlipidemia: Secondary | ICD-10-CM | POA: Diagnosis not present

## 2019-03-28 DIAGNOSIS — E785 Hyperlipidemia, unspecified: Secondary | ICD-10-CM | POA: Diagnosis not present

## 2019-03-28 DIAGNOSIS — I10 Essential (primary) hypertension: Secondary | ICD-10-CM | POA: Diagnosis not present

## 2019-04-04 DIAGNOSIS — I2510 Atherosclerotic heart disease of native coronary artery without angina pectoris: Secondary | ICD-10-CM | POA: Diagnosis not present

## 2019-04-04 DIAGNOSIS — E782 Mixed hyperlipidemia: Secondary | ICD-10-CM | POA: Diagnosis not present

## 2019-04-04 DIAGNOSIS — R7301 Impaired fasting glucose: Secondary | ICD-10-CM | POA: Diagnosis not present

## 2019-04-04 DIAGNOSIS — R945 Abnormal results of liver function studies: Secondary | ICD-10-CM | POA: Diagnosis not present

## 2019-04-04 DIAGNOSIS — I10 Essential (primary) hypertension: Secondary | ICD-10-CM | POA: Diagnosis not present

## 2019-04-04 DIAGNOSIS — K219 Gastro-esophageal reflux disease without esophagitis: Secondary | ICD-10-CM | POA: Diagnosis not present

## 2019-04-04 DIAGNOSIS — Z87440 Personal history of urinary (tract) infections: Secondary | ICD-10-CM | POA: Diagnosis not present

## 2019-04-04 DIAGNOSIS — G629 Polyneuropathy, unspecified: Secondary | ICD-10-CM | POA: Diagnosis not present

## 2019-04-04 DIAGNOSIS — R944 Abnormal results of kidney function studies: Secondary | ICD-10-CM | POA: Diagnosis not present

## 2019-04-17 DIAGNOSIS — Z87440 Personal history of urinary (tract) infections: Secondary | ICD-10-CM | POA: Diagnosis not present

## 2019-04-17 DIAGNOSIS — R7301 Impaired fasting glucose: Secondary | ICD-10-CM | POA: Diagnosis not present

## 2019-04-17 DIAGNOSIS — R944 Abnormal results of kidney function studies: Secondary | ICD-10-CM | POA: Diagnosis not present

## 2019-04-17 DIAGNOSIS — I2510 Atherosclerotic heart disease of native coronary artery without angina pectoris: Secondary | ICD-10-CM | POA: Diagnosis not present

## 2019-04-17 DIAGNOSIS — J689 Unspecified respiratory condition due to chemicals, gases, fumes and vapors: Secondary | ICD-10-CM | POA: Diagnosis not present

## 2019-04-17 DIAGNOSIS — E782 Mixed hyperlipidemia: Secondary | ICD-10-CM | POA: Diagnosis not present

## 2019-04-17 DIAGNOSIS — E785 Hyperlipidemia, unspecified: Secondary | ICD-10-CM | POA: Diagnosis not present

## 2019-04-17 DIAGNOSIS — G629 Polyneuropathy, unspecified: Secondary | ICD-10-CM | POA: Diagnosis not present

## 2019-04-17 DIAGNOSIS — K219 Gastro-esophageal reflux disease without esophagitis: Secondary | ICD-10-CM | POA: Diagnosis not present

## 2019-04-17 DIAGNOSIS — R945 Abnormal results of liver function studies: Secondary | ICD-10-CM | POA: Diagnosis not present

## 2019-04-17 DIAGNOSIS — I10 Essential (primary) hypertension: Secondary | ICD-10-CM | POA: Diagnosis not present

## 2019-04-17 DIAGNOSIS — N390 Urinary tract infection, site not specified: Secondary | ICD-10-CM | POA: Diagnosis not present

## 2019-04-23 DIAGNOSIS — R944 Abnormal results of kidney function studies: Secondary | ICD-10-CM | POA: Diagnosis not present

## 2019-04-23 DIAGNOSIS — Z23 Encounter for immunization: Secondary | ICD-10-CM | POA: Diagnosis not present

## 2019-04-23 DIAGNOSIS — M545 Low back pain: Secondary | ICD-10-CM | POA: Diagnosis not present

## 2019-05-15 DIAGNOSIS — I252 Old myocardial infarction: Secondary | ICD-10-CM | POA: Diagnosis not present

## 2019-05-15 DIAGNOSIS — R7301 Impaired fasting glucose: Secondary | ICD-10-CM | POA: Diagnosis not present

## 2019-05-15 DIAGNOSIS — E785 Hyperlipidemia, unspecified: Secondary | ICD-10-CM | POA: Diagnosis not present

## 2019-05-15 DIAGNOSIS — K219 Gastro-esophageal reflux disease without esophagitis: Secondary | ICD-10-CM | POA: Diagnosis not present

## 2019-05-15 DIAGNOSIS — E782 Mixed hyperlipidemia: Secondary | ICD-10-CM | POA: Diagnosis not present

## 2019-05-15 DIAGNOSIS — I10 Essential (primary) hypertension: Secondary | ICD-10-CM | POA: Diagnosis not present

## 2019-05-16 ENCOUNTER — Other Ambulatory Visit: Payer: Self-pay

## 2019-06-29 DIAGNOSIS — E785 Hyperlipidemia, unspecified: Secondary | ICD-10-CM | POA: Diagnosis not present

## 2019-06-29 DIAGNOSIS — I252 Old myocardial infarction: Secondary | ICD-10-CM | POA: Diagnosis not present

## 2019-06-29 DIAGNOSIS — K219 Gastro-esophageal reflux disease without esophagitis: Secondary | ICD-10-CM | POA: Diagnosis not present

## 2019-06-29 DIAGNOSIS — E782 Mixed hyperlipidemia: Secondary | ICD-10-CM | POA: Diagnosis not present

## 2019-06-29 DIAGNOSIS — I10 Essential (primary) hypertension: Secondary | ICD-10-CM | POA: Diagnosis not present

## 2019-06-29 DIAGNOSIS — R7301 Impaired fasting glucose: Secondary | ICD-10-CM | POA: Diagnosis not present

## 2019-10-23 DIAGNOSIS — M791 Myalgia, unspecified site: Secondary | ICD-10-CM | POA: Diagnosis not present

## 2019-10-23 DIAGNOSIS — I252 Old myocardial infarction: Secondary | ICD-10-CM | POA: Diagnosis not present

## 2019-10-23 DIAGNOSIS — E559 Vitamin D deficiency, unspecified: Secondary | ICD-10-CM | POA: Diagnosis not present

## 2019-10-23 DIAGNOSIS — M545 Low back pain: Secondary | ICD-10-CM | POA: Diagnosis not present

## 2019-10-23 DIAGNOSIS — G629 Polyneuropathy, unspecified: Secondary | ICD-10-CM | POA: Diagnosis not present

## 2019-10-23 DIAGNOSIS — E785 Hyperlipidemia, unspecified: Secondary | ICD-10-CM | POA: Diagnosis not present

## 2019-10-23 DIAGNOSIS — J309 Allergic rhinitis, unspecified: Secondary | ICD-10-CM | POA: Diagnosis not present

## 2019-10-23 DIAGNOSIS — J689 Unspecified respiratory condition due to chemicals, gases, fumes and vapors: Secondary | ICD-10-CM | POA: Diagnosis not present

## 2019-10-23 DIAGNOSIS — E782 Mixed hyperlipidemia: Secondary | ICD-10-CM | POA: Diagnosis not present

## 2019-10-23 DIAGNOSIS — I10 Essential (primary) hypertension: Secondary | ICD-10-CM | POA: Diagnosis not present

## 2019-10-23 DIAGNOSIS — K219 Gastro-esophageal reflux disease without esophagitis: Secondary | ICD-10-CM | POA: Diagnosis not present

## 2019-10-23 DIAGNOSIS — I2510 Atherosclerotic heart disease of native coronary artery without angina pectoris: Secondary | ICD-10-CM | POA: Diagnosis not present

## 2019-10-26 DIAGNOSIS — J689 Unspecified respiratory condition due to chemicals, gases, fumes and vapors: Secondary | ICD-10-CM | POA: Diagnosis not present

## 2019-10-26 DIAGNOSIS — E785 Hyperlipidemia, unspecified: Secondary | ICD-10-CM | POA: Diagnosis not present

## 2019-10-26 DIAGNOSIS — J309 Allergic rhinitis, unspecified: Secondary | ICD-10-CM | POA: Diagnosis not present

## 2019-10-26 DIAGNOSIS — I252 Old myocardial infarction: Secondary | ICD-10-CM | POA: Diagnosis not present

## 2019-10-26 DIAGNOSIS — I2510 Atherosclerotic heart disease of native coronary artery without angina pectoris: Secondary | ICD-10-CM | POA: Diagnosis not present

## 2019-10-26 DIAGNOSIS — Z87440 Personal history of urinary (tract) infections: Secondary | ICD-10-CM | POA: Diagnosis not present

## 2019-10-26 DIAGNOSIS — I10 Essential (primary) hypertension: Secondary | ICD-10-CM | POA: Diagnosis not present

## 2019-10-26 DIAGNOSIS — Z0001 Encounter for general adult medical examination with abnormal findings: Secondary | ICD-10-CM | POA: Diagnosis not present

## 2019-10-26 DIAGNOSIS — K219 Gastro-esophageal reflux disease without esophagitis: Secondary | ICD-10-CM | POA: Diagnosis not present

## 2019-10-26 DIAGNOSIS — E673 Hypervitaminosis D: Secondary | ICD-10-CM | POA: Diagnosis not present

## 2019-10-26 DIAGNOSIS — E782 Mixed hyperlipidemia: Secondary | ICD-10-CM | POA: Diagnosis not present

## 2019-10-26 DIAGNOSIS — R945 Abnormal results of liver function studies: Secondary | ICD-10-CM | POA: Diagnosis not present

## 2019-10-26 DIAGNOSIS — G629 Polyneuropathy, unspecified: Secondary | ICD-10-CM | POA: Diagnosis not present

## 2019-10-26 DIAGNOSIS — H6121 Impacted cerumen, right ear: Secondary | ICD-10-CM | POA: Diagnosis not present

## 2019-10-26 DIAGNOSIS — I129 Hypertensive chronic kidney disease with stage 1 through stage 4 chronic kidney disease, or unspecified chronic kidney disease: Secondary | ICD-10-CM | POA: Diagnosis not present

## 2019-10-26 DIAGNOSIS — N1831 Chronic kidney disease, stage 3a: Secondary | ICD-10-CM | POA: Diagnosis not present

## 2019-11-12 DIAGNOSIS — E785 Hyperlipidemia, unspecified: Secondary | ICD-10-CM | POA: Diagnosis not present

## 2019-11-12 DIAGNOSIS — I2510 Atherosclerotic heart disease of native coronary artery without angina pectoris: Secondary | ICD-10-CM | POA: Diagnosis not present

## 2019-11-12 DIAGNOSIS — H6121 Impacted cerumen, right ear: Secondary | ICD-10-CM | POA: Diagnosis not present

## 2019-11-12 DIAGNOSIS — J309 Allergic rhinitis, unspecified: Secondary | ICD-10-CM | POA: Diagnosis not present

## 2019-11-12 DIAGNOSIS — G629 Polyneuropathy, unspecified: Secondary | ICD-10-CM | POA: Diagnosis not present

## 2019-11-12 DIAGNOSIS — J689 Unspecified respiratory condition due to chemicals, gases, fumes and vapors: Secondary | ICD-10-CM | POA: Diagnosis not present

## 2019-11-12 DIAGNOSIS — I129 Hypertensive chronic kidney disease with stage 1 through stage 4 chronic kidney disease, or unspecified chronic kidney disease: Secondary | ICD-10-CM | POA: Diagnosis not present

## 2019-11-12 DIAGNOSIS — I10 Essential (primary) hypertension: Secondary | ICD-10-CM | POA: Diagnosis not present

## 2019-11-12 DIAGNOSIS — I252 Old myocardial infarction: Secondary | ICD-10-CM | POA: Diagnosis not present

## 2019-11-12 DIAGNOSIS — E673 Hypervitaminosis D: Secondary | ICD-10-CM | POA: Diagnosis not present

## 2019-11-12 DIAGNOSIS — E782 Mixed hyperlipidemia: Secondary | ICD-10-CM | POA: Diagnosis not present

## 2020-04-18 DIAGNOSIS — Z23 Encounter for immunization: Secondary | ICD-10-CM | POA: Diagnosis not present

## 2020-05-02 DIAGNOSIS — E785 Hyperlipidemia, unspecified: Secondary | ICD-10-CM | POA: Diagnosis not present

## 2020-05-02 DIAGNOSIS — E673 Hypervitaminosis D: Secondary | ICD-10-CM | POA: Diagnosis not present

## 2020-05-02 DIAGNOSIS — I10 Essential (primary) hypertension: Secondary | ICD-10-CM | POA: Diagnosis not present

## 2020-05-02 DIAGNOSIS — N390 Urinary tract infection, site not specified: Secondary | ICD-10-CM | POA: Diagnosis not present

## 2020-05-02 DIAGNOSIS — E782 Mixed hyperlipidemia: Secondary | ICD-10-CM | POA: Diagnosis not present

## 2020-05-02 DIAGNOSIS — K219 Gastro-esophageal reflux disease without esophagitis: Secondary | ICD-10-CM | POA: Diagnosis not present

## 2020-05-02 DIAGNOSIS — R945 Abnormal results of liver function studies: Secondary | ICD-10-CM | POA: Diagnosis not present

## 2020-05-02 DIAGNOSIS — R7301 Impaired fasting glucose: Secondary | ICD-10-CM | POA: Diagnosis not present

## 2020-05-02 DIAGNOSIS — N1831 Chronic kidney disease, stage 3a: Secondary | ICD-10-CM | POA: Diagnosis not present

## 2020-05-02 DIAGNOSIS — R944 Abnormal results of kidney function studies: Secondary | ICD-10-CM | POA: Diagnosis not present

## 2020-05-02 DIAGNOSIS — I2510 Atherosclerotic heart disease of native coronary artery without angina pectoris: Secondary | ICD-10-CM | POA: Diagnosis not present

## 2020-05-30 DIAGNOSIS — Z23 Encounter for immunization: Secondary | ICD-10-CM | POA: Diagnosis not present

## 2020-05-30 DIAGNOSIS — N1831 Chronic kidney disease, stage 3a: Secondary | ICD-10-CM | POA: Diagnosis not present

## 2020-05-30 DIAGNOSIS — H6121 Impacted cerumen, right ear: Secondary | ICD-10-CM | POA: Diagnosis not present

## 2020-05-30 DIAGNOSIS — I2510 Atherosclerotic heart disease of native coronary artery without angina pectoris: Secondary | ICD-10-CM | POA: Diagnosis not present

## 2020-05-30 DIAGNOSIS — R7303 Prediabetes: Secondary | ICD-10-CM | POA: Diagnosis not present

## 2020-05-30 DIAGNOSIS — K219 Gastro-esophageal reflux disease without esophagitis: Secondary | ICD-10-CM | POA: Diagnosis not present

## 2020-05-30 DIAGNOSIS — I129 Hypertensive chronic kidney disease with stage 1 through stage 4 chronic kidney disease, or unspecified chronic kidney disease: Secondary | ICD-10-CM | POA: Diagnosis not present

## 2020-05-30 DIAGNOSIS — G629 Polyneuropathy, unspecified: Secondary | ICD-10-CM | POA: Diagnosis not present

## 2020-05-30 DIAGNOSIS — R945 Abnormal results of liver function studies: Secondary | ICD-10-CM | POA: Diagnosis not present

## 2020-05-30 DIAGNOSIS — E782 Mixed hyperlipidemia: Secondary | ICD-10-CM | POA: Diagnosis not present

## 2020-05-30 DIAGNOSIS — Z87440 Personal history of urinary (tract) infections: Secondary | ICD-10-CM | POA: Diagnosis not present

## 2020-05-30 DIAGNOSIS — E673 Hypervitaminosis D: Secondary | ICD-10-CM | POA: Diagnosis not present

## 2020-09-26 DIAGNOSIS — N1831 Chronic kidney disease, stage 3a: Secondary | ICD-10-CM | POA: Diagnosis not present

## 2020-09-26 DIAGNOSIS — R7301 Impaired fasting glucose: Secondary | ICD-10-CM | POA: Diagnosis not present

## 2020-09-26 DIAGNOSIS — E673 Hypervitaminosis D: Secondary | ICD-10-CM | POA: Diagnosis not present

## 2020-09-26 DIAGNOSIS — I129 Hypertensive chronic kidney disease with stage 1 through stage 4 chronic kidney disease, or unspecified chronic kidney disease: Secondary | ICD-10-CM | POA: Diagnosis not present

## 2020-09-26 DIAGNOSIS — E782 Mixed hyperlipidemia: Secondary | ICD-10-CM | POA: Diagnosis not present

## 2020-09-26 DIAGNOSIS — R945 Abnormal results of liver function studies: Secondary | ICD-10-CM | POA: Diagnosis not present

## 2020-09-26 DIAGNOSIS — E785 Hyperlipidemia, unspecified: Secondary | ICD-10-CM | POA: Diagnosis not present

## 2020-09-26 DIAGNOSIS — I10 Essential (primary) hypertension: Secondary | ICD-10-CM | POA: Diagnosis not present

## 2020-10-15 DIAGNOSIS — E673 Hypervitaminosis D: Secondary | ICD-10-CM | POA: Diagnosis not present

## 2020-10-15 DIAGNOSIS — Z87440 Personal history of urinary (tract) infections: Secondary | ICD-10-CM | POA: Diagnosis not present

## 2020-10-15 DIAGNOSIS — G629 Polyneuropathy, unspecified: Secondary | ICD-10-CM | POA: Diagnosis not present

## 2020-10-15 DIAGNOSIS — R7303 Prediabetes: Secondary | ICD-10-CM | POA: Diagnosis not present

## 2020-10-15 DIAGNOSIS — K219 Gastro-esophageal reflux disease without esophagitis: Secondary | ICD-10-CM | POA: Diagnosis not present

## 2020-10-15 DIAGNOSIS — I129 Hypertensive chronic kidney disease with stage 1 through stage 4 chronic kidney disease, or unspecified chronic kidney disease: Secondary | ICD-10-CM | POA: Diagnosis not present

## 2020-10-15 DIAGNOSIS — I2510 Atherosclerotic heart disease of native coronary artery without angina pectoris: Secondary | ICD-10-CM | POA: Diagnosis not present

## 2020-10-15 DIAGNOSIS — E782 Mixed hyperlipidemia: Secondary | ICD-10-CM | POA: Diagnosis not present

## 2020-10-15 DIAGNOSIS — R2243 Localized swelling, mass and lump, lower limb, bilateral: Secondary | ICD-10-CM | POA: Diagnosis not present

## 2020-10-15 DIAGNOSIS — R945 Abnormal results of liver function studies: Secondary | ICD-10-CM | POA: Diagnosis not present

## 2020-10-15 DIAGNOSIS — N1831 Chronic kidney disease, stage 3a: Secondary | ICD-10-CM | POA: Diagnosis not present

## 2020-10-29 DIAGNOSIS — H25811 Combined forms of age-related cataract, right eye: Secondary | ICD-10-CM | POA: Diagnosis not present

## 2020-10-29 DIAGNOSIS — H5213 Myopia, bilateral: Secondary | ICD-10-CM | POA: Diagnosis not present

## 2020-10-29 DIAGNOSIS — H25812 Combined forms of age-related cataract, left eye: Secondary | ICD-10-CM | POA: Diagnosis not present

## 2020-10-29 DIAGNOSIS — H43813 Vitreous degeneration, bilateral: Secondary | ICD-10-CM | POA: Diagnosis not present

## 2020-10-30 DIAGNOSIS — E785 Hyperlipidemia, unspecified: Secondary | ICD-10-CM | POA: Diagnosis not present

## 2020-12-08 DIAGNOSIS — D234 Other benign neoplasm of skin of scalp and neck: Secondary | ICD-10-CM | POA: Diagnosis not present

## 2020-12-08 DIAGNOSIS — Z719 Counseling, unspecified: Secondary | ICD-10-CM | POA: Diagnosis not present

## 2020-12-08 DIAGNOSIS — L570 Actinic keratosis: Secondary | ICD-10-CM | POA: Diagnosis not present

## 2020-12-08 DIAGNOSIS — Z809 Family history of malignant neoplasm, unspecified: Secondary | ICD-10-CM | POA: Diagnosis not present

## 2020-12-08 DIAGNOSIS — D2339 Other benign neoplasm of skin of other parts of face: Secondary | ICD-10-CM | POA: Diagnosis not present

## 2020-12-08 DIAGNOSIS — D23121 Other benign neoplasm of skin of left upper eyelid, including canthus: Secondary | ICD-10-CM | POA: Diagnosis not present

## 2020-12-08 DIAGNOSIS — D485 Neoplasm of uncertain behavior of skin: Secondary | ICD-10-CM | POA: Diagnosis not present

## 2020-12-08 DIAGNOSIS — D23111 Other benign neoplasm of skin of right upper eyelid, including canthus: Secondary | ICD-10-CM | POA: Diagnosis not present

## 2020-12-08 DIAGNOSIS — Z1283 Encounter for screening for malignant neoplasm of skin: Secondary | ICD-10-CM | POA: Diagnosis not present

## 2020-12-08 DIAGNOSIS — D230 Other benign neoplasm of skin of lip: Secondary | ICD-10-CM | POA: Diagnosis not present

## 2020-12-08 DIAGNOSIS — D2321 Other benign neoplasm of skin of right ear and external auricular canal: Secondary | ICD-10-CM | POA: Diagnosis not present

## 2020-12-08 DIAGNOSIS — D2322 Other benign neoplasm of skin of left ear and external auricular canal: Secondary | ICD-10-CM | POA: Diagnosis not present

## 2020-12-08 DIAGNOSIS — L909 Atrophic disorder of skin, unspecified: Secondary | ICD-10-CM | POA: Diagnosis not present

## 2021-01-09 DIAGNOSIS — Z1231 Encounter for screening mammogram for malignant neoplasm of breast: Secondary | ICD-10-CM | POA: Diagnosis not present

## 2021-01-09 DIAGNOSIS — N904 Leukoplakia of vulva: Secondary | ICD-10-CM | POA: Diagnosis not present

## 2021-01-09 DIAGNOSIS — M8580 Other specified disorders of bone density and structure, unspecified site: Secondary | ICD-10-CM | POA: Diagnosis not present

## 2021-01-09 DIAGNOSIS — N8111 Cystocele, midline: Secondary | ICD-10-CM | POA: Diagnosis not present

## 2021-01-09 DIAGNOSIS — Z1212 Encounter for screening for malignant neoplasm of rectum: Secondary | ICD-10-CM | POA: Diagnosis not present

## 2021-01-09 DIAGNOSIS — Z780 Asymptomatic menopausal state: Secondary | ICD-10-CM | POA: Diagnosis not present

## 2021-01-09 DIAGNOSIS — Z124 Encounter for screening for malignant neoplasm of cervix: Secondary | ICD-10-CM | POA: Diagnosis not present

## 2021-02-18 DIAGNOSIS — H2513 Age-related nuclear cataract, bilateral: Secondary | ICD-10-CM | POA: Diagnosis not present

## 2021-02-18 DIAGNOSIS — H4423 Degenerative myopia, bilateral: Secondary | ICD-10-CM | POA: Diagnosis not present

## 2021-03-06 DIAGNOSIS — H524 Presbyopia: Secondary | ICD-10-CM | POA: Diagnosis not present

## 2021-03-06 DIAGNOSIS — H25811 Combined forms of age-related cataract, right eye: Secondary | ICD-10-CM | POA: Diagnosis not present

## 2021-03-06 DIAGNOSIS — H25812 Combined forms of age-related cataract, left eye: Secondary | ICD-10-CM | POA: Diagnosis not present

## 2021-03-06 DIAGNOSIS — H43813 Vitreous degeneration, bilateral: Secondary | ICD-10-CM | POA: Diagnosis not present

## 2021-03-26 DIAGNOSIS — H25811 Combined forms of age-related cataract, right eye: Secondary | ICD-10-CM | POA: Diagnosis not present

## 2021-03-26 DIAGNOSIS — H25011 Cortical age-related cataract, right eye: Secondary | ICD-10-CM | POA: Diagnosis not present

## 2021-03-31 DIAGNOSIS — Z961 Presence of intraocular lens: Secondary | ICD-10-CM | POA: Diagnosis not present

## 2021-03-31 DIAGNOSIS — H25812 Combined forms of age-related cataract, left eye: Secondary | ICD-10-CM | POA: Diagnosis not present

## 2021-04-02 DIAGNOSIS — H25012 Cortical age-related cataract, left eye: Secondary | ICD-10-CM | POA: Diagnosis not present

## 2021-04-02 DIAGNOSIS — H25812 Combined forms of age-related cataract, left eye: Secondary | ICD-10-CM | POA: Diagnosis not present

## 2021-04-15 DIAGNOSIS — Z13820 Encounter for screening for osteoporosis: Secondary | ICD-10-CM | POA: Diagnosis not present

## 2021-04-15 DIAGNOSIS — Z23 Encounter for immunization: Secondary | ICD-10-CM | POA: Diagnosis not present

## 2021-04-23 DIAGNOSIS — Z0001 Encounter for general adult medical examination with abnormal findings: Secondary | ICD-10-CM | POA: Diagnosis not present

## 2021-05-07 DIAGNOSIS — R7301 Impaired fasting glucose: Secondary | ICD-10-CM | POA: Diagnosis not present

## 2021-05-07 DIAGNOSIS — I10 Essential (primary) hypertension: Secondary | ICD-10-CM | POA: Diagnosis not present

## 2021-05-19 DIAGNOSIS — E785 Hyperlipidemia, unspecified: Secondary | ICD-10-CM | POA: Diagnosis not present

## 2021-05-19 DIAGNOSIS — R7303 Prediabetes: Secondary | ICD-10-CM | POA: Diagnosis not present

## 2021-05-19 DIAGNOSIS — I10 Essential (primary) hypertension: Secondary | ICD-10-CM | POA: Diagnosis not present

## 2021-05-19 DIAGNOSIS — R7301 Impaired fasting glucose: Secondary | ICD-10-CM | POA: Diagnosis not present

## 2021-09-24 ENCOUNTER — Telehealth: Payer: Self-pay | Admitting: Cardiology

## 2021-09-24 NOTE — Telephone Encounter (Signed)
Reports more frequent SOB for the past 3 weeks with activity. Denies swelling, chest pain or dizziness. Gave first available appointment to see [facility] on 10/20/21 '@3'$ :30 pm at [facility] office and advised if she develops worsening symptoms, to go to the ED for an evaluation. Verbalized understanding of plan. ?

## 2021-09-24 NOTE — Telephone Encounter (Signed)
Pt c/o Shortness Of Breath: STAT if SOB developed within the last 24 hours or pt is noticeably SOB on the phone ? ?1. Are you currently SOB (can you hear that pt is SOB on the phone)? No ? ?2. How long have you been experiencing SOB?  ?3 weeks ? ?3. Are you SOB when sitting or when up moving around? Moving around ? ?4. Are you currently experiencing any other symptoms? NO ? ?Pt states that SOB is becoming more frequent, like when she vacuums, take the dog out, etc. Pt has appt on 6/1, please advise ?

## 2021-09-29 ENCOUNTER — Emergency Department (HOSPITAL_COMMUNITY): Payer: Medicare Other

## 2021-09-29 ENCOUNTER — Encounter (HOSPITAL_COMMUNITY): Payer: Self-pay | Admitting: *Deleted

## 2021-09-29 ENCOUNTER — Emergency Department (HOSPITAL_COMMUNITY)
Admission: EM | Admit: 2021-09-29 | Discharge: 2021-09-29 | Disposition: A | Discharge status: Home / Self Care | Payer: Medicare Other | Attending: Emergency Medicine | Admitting: Emergency Medicine

## 2021-09-29 DIAGNOSIS — Z79899 Other long term (current) drug therapy: Secondary | ICD-10-CM | POA: Insufficient documentation

## 2021-09-29 DIAGNOSIS — Z7982 Long term (current) use of aspirin: Secondary | ICD-10-CM | POA: Diagnosis not present

## 2021-09-29 DIAGNOSIS — R7989 Other specified abnormal findings of blood chemistry: Secondary | ICD-10-CM | POA: Insufficient documentation

## 2021-09-29 DIAGNOSIS — J9811 Atelectasis: Secondary | ICD-10-CM | POA: Diagnosis not present

## 2021-09-29 DIAGNOSIS — R600 Localized edema: Secondary | ICD-10-CM | POA: Insufficient documentation

## 2021-09-29 DIAGNOSIS — R0609 Other forms of dyspnea: Secondary | ICD-10-CM | POA: Diagnosis not present

## 2021-09-29 DIAGNOSIS — R0600 Dyspnea, unspecified: Secondary | ICD-10-CM

## 2021-09-29 DIAGNOSIS — R0602 Shortness of breath: Secondary | ICD-10-CM | POA: Insufficient documentation

## 2021-09-29 DIAGNOSIS — J439 Emphysema, unspecified: Secondary | ICD-10-CM | POA: Diagnosis not present

## 2021-09-29 LAB — CBC
HCT: 40.9 % (ref 36.0–46.0)
Hemoglobin: 13.9 g/dL (ref 12.0–15.0)
MCH: 31.2 pg (ref 26.0–34.0)
MCHC: 34 g/dL (ref 30.0–36.0)
MCV: 91.9 fL (ref 80.0–100.0)
Platelets: 266 10*3/uL (ref 150–400)
RBC: 4.45 MIL/uL (ref 3.87–5.11)
RDW: 13.2 % (ref 11.5–15.5)
WBC: 9.5 10*3/uL (ref 4.0–10.5)
nRBC: 0 % (ref 0.0–0.2)

## 2021-09-29 LAB — BASIC METABOLIC PANEL
Anion gap: 9 (ref 5–15)
BUN: 30 mg/dL — ABNORMAL HIGH (ref 8–23)
CO2: 20 mmol/L — ABNORMAL LOW (ref 22–32)
Calcium: 9.6 mg/dL (ref 8.9–10.3)
Chloride: 108 mmol/L (ref 98–111)
Creatinine, Ser: 0.91 mg/dL (ref 0.44–1.00)
GFR, Estimated: >60 mL/min (ref >60)
Glucose, Bld: 100 mg/dL — ABNORMAL HIGH (ref 70–99)
Potassium: 3.7 mmol/L (ref 3.5–5.1)
Sodium: 137 mmol/L (ref 135–145)

## 2021-09-29 LAB — TROPONIN I (HIGH SENSITIVITY): Troponin I (High Sensitivity): 3 ng/L (ref <18)

## 2021-09-29 LAB — D-DIMER, QUANTITATIVE: D-Dimer, Quant: 0.5 ug/mL-FEU (ref 0.00–0.50)

## 2021-09-29 LAB — BRAIN NATRIURETIC PEPTIDE: B Natriuretic Peptide: 12 pg/mL (ref 0.0–100.0)

## 2021-09-29 NOTE — Discharge Instructions (Signed)
The test today in the ED were reassuring.  No signs of pneumonia or heart failure or blood clots.  Follow-up with your cardiologist as planned.  Consider following up with your primary care doctor to get a repeat echocardiogram while you are waiting for your cardiology appointment.  Return to the ED for worsening symptoms ?

## 2021-09-29 NOTE — ED Triage Notes (Signed)
C/o shortness of breath ,episodes becoming more frequent ?

## 2021-09-29 NOTE — ED Provider Notes (Signed)
?[facility] ?Provider Note ? ? ?CSN: [id number] ?Arrival date & time: 09/29/21  1410 ? ?  ? ?History ? ?Chief Complaint  ?Patient presents with  ? Shortness of Breath  ? ? ?Nancy Winters is a 76 y.o. female. ? ? ?Shortness of Breath ? ?Patient has a history of reflux, TIA, Takotsubo syndrome, nonischemic cardiomyopathy who presents with complaints of dyspnea on exertion.  Patient states she had an episode of cardiomyopathy in the past that resulted in similar symptoms.  Back in 2015 she had an echocardiogram that showed she had ejection fraction of 40% with hypokinesis of the true apex and periapical segments.  Ultimately the cardiomyopathy resolved after several months.  Patient's last echocardiogram as noted in the medical record shows an LVEF at 60 to 65%.  The date of her most recent echocardiogram was June 2020.  Patient states she takes metoprolol but is not on any diuretic medications.  Recently she has started to noticed dyspnea on exertion.  It is more notable over the last 3 weeks.  She has not had any trouble with any chest pain.  No lightheadedness.  No syncope.  She has not had any swelling.  She tried to call her cardiologist to schedule an appointment but the first available was on May 2.  Patient's family was concerned and encouraged her to come to the ED for further evaluation as her symptoms have persisted. ? ?Home Medications ?Prior to Admission medications   ?Medication Sig Start Date End Date Taking? Authorizing Provider  ?aspirin EC 81 MG EC tablet Take 1 tablet (81 mg total) by mouth daily. 02/14/14   Almyra Deforest, PA  ?esomeprazole (NEXIUM) 40 MG capsule Take 40 mg by mouth daily at 12 noon.    [provider]  ?levocetirizine (XYZAL) 5 MG tablet Take 5 mg by mouth every evening.    [provider]  ?losartan (COZAAR) 25 MG tablet Take 1 tablet (25 mg total) by mouth daily. 03/18/14   Lendon Colonel, NP  ?metoprolol tartrate (LOPRESSOR) 50 MG tablet  Take 1 tablet by mouth 2 (two) times a day. 09/01/18   [provider]  ?Multiple Vitamins-Minerals (MULTIVITAMINS THER. W/MINERALS) TABS tablet Take 1 tablet by mouth daily.    [provider]  ?nitrofurantoin (MACRODANTIN) 50 MG capsule Take 50 mg by mouth at bedtime.    [provider]  ?nitroGLYCERIN (NITROSTAT) 0.4 MG SL tablet PLACE 1 TABLET UNDER THE TONGUE EVERY 5 MINUTES AS NEEDED FOR CHEST PAIN 07/08/15   Lendon Colonel, NP  ?   ? ?Allergies    ?Ciprofloxacin   ? ?Review of Systems   ?Review of Systems  ?Respiratory:  Positive for shortness of breath.   ?All other systems reviewed and are negative. ? ?Physical Exam ?Updated Vital Signs ?BP (!) 148/98 (BP Location: Right Arm)   Pulse 68   Temp 97.6 ?F (36.4 ?C) (Oral)   Resp 18   Ht 1.676 m ('5\' 6"'$ )   Wt 86.2 kg   SpO2 99%   BMI 30.67 kg/m?  ?Physical Exam ?Vitals and nursing note reviewed.  ?Constitutional:   ?   General: She is not in acute distress. ?   Appearance: She is well-developed.  ?HENT:  ?   Head: Normocephalic and atraumatic.  ?   Right Ear: External ear normal.  ?   Left Ear: External ear normal.  ?Eyes:  ?   General: No scleral icterus.    ?   Right  eye: No discharge.     ?   Left eye: No discharge.  ?   Conjunctiva/sclera: Conjunctivae normal.  ?Neck:  ?   Trachea: No tracheal deviation.  ?Cardiovascular:  ?   Rate and Rhythm: Normal rate and regular rhythm.  ?Pulmonary:  ?   Effort: Pulmonary effort is normal. No respiratory distress.  ?   Breath sounds: Normal breath sounds. No stridor. No wheezing or rales.  ?Abdominal:  ?   General: Bowel sounds are normal. There is no distension.  ?   Palpations: Abdomen is soft.  ?   Tenderness: There is no abdominal tenderness. There is no guarding or rebound.  ?Musculoskeletal:     ?   General: No tenderness or deformity.  ?   Cervical back: Neck supple.  ?   Comments: Trace edema lower legs  ?Skin: ?   General: Skin is warm and dry.  ?   Findings: No rash.   ?Neurological:  ?   General: No focal deficit present.  ?   Mental Status: She is alert.  ?   Cranial Nerves: No cranial nerve deficit (no facial droop, extraocular movements intact, no slurred speech).  ?   Sensory: No sensory deficit.  ?   Motor: No abnormal muscle tone or seizure activity.  ?   Coordination: Coordination normal.  ?Psychiatric:     ?   Mood and Affect: Mood normal.  ? ? ?ED Results / Procedures / Treatments   ?Labs ?(all labs ordered are listed, but only abnormal results are displayed) ?Labs Reviewed  ?BASIC METABOLIC PANEL - Abnormal; Notable for the following components:  ?    Result Value  ? CO2 20 (*)   ? Glucose, Bld 100 (*)   ? BUN 30 (*)   ? All other components within normal limits  ?CBC  ?BRAIN NATRIURETIC PEPTIDE  ?D-DIMER, QUANTITATIVE  ?TROPONIN I (HIGH SENSITIVITY)  ? ? ?EKG ?EKG Interpretation ? ?Date/Time:  Tuesday September 29 2021 14:26:01 EDT ?Ventricular Rate:  68 ?PR Interval:  214 ?QRS Duration: 62 ?QT Interval:  420 ?QTC Calculation: 446 ?R Axis:   54 ?Text Interpretation: Sinus rhythm with 1st degree A-V block Septal infarct (cited on or before 19-Sep-2017) Abnormal ECG When compared with ECG of 19-Sep-2017 14:58, No significant change was found Confirmed by Dorie Rank [phone]) on 09/29/2021 3:58:05 PM ? ?Radiology ?DG Chest 2 View ? ?Result Date: 09/29/2021 ?CLINICAL DATA:  Dyspnea, shortness of breath with episodes becoming more for frequent EXAM: CHEST - 2 VIEW COMPARISON:  09/19/2017 FINDINGS: Normal heart size, mediastinal contours, and pulmonary vascularity. Lungs appear emphysematous with biapical scarring. Minimal subsegmental atelectasis RIGHT base. No acute infiltrate, pleural effusion, or pneumothorax. Bones demineralized. IMPRESSION: Emphysematous changes with biapical scarring. No acute abnormalities. Emphysema (ICD10-J43.9). Electronically Signed   By: Lavonia Dana M.D.   On: 09/29/2021 16:14   ? ?Procedures ?Procedures  ? ? ?Medications Ordered in ED ?Medications -  No data to display ? ?ED Course/ Medical Decision Making/ A&P ?Clinical Course as of 09/29/21 1919  ?Tue Sep 29, 2021  ?1912 Troponin I (High Sensitivity) ?Troponin normal [JK]  ?1913 D-dimer, quantitative ?D-dimer normal [JK]  ?1913 Brain natriuretic peptide ?BNP normal [JK]  ?7253 Basic metabolic panel(!) ?Metabolic panel normal with exception of BUN increased [JK]  ?1913 CBC ?Normal [JK]  ?[facility] 2 View ?Radiology reports chest x-ray images reviewed.  No acute pneumonia [JK]  ?  ?Clinical Course User Index ?[JK] Dorie Rank, MD  ? ?                        ?  Medical Decision Making ?Amount and/or Complexity of Data Reviewed ?Labs: ordered. Decision-making details documented in ED Course. ?Radiology: ordered. Decision-making details documented in ED Course. ? ? ?Patient presented to the ED for evaluation of shortness of breath.  Patient has history of Takotsubo's already myopathy.  Subsequently her cardiomyopathy resolved and she has had normal ejection fraction for years.  Patient had noticed some intermittent episodes of dyspnea on exertion over the last couple weeks.  She called her cardiologist but does not have an appointment for several weeks.  ED work-up is reassuring.  There is no pneumonia there is no pneumothorax.  Findings are not suggestive of PE.  No signs of any myocardial ischemia.  Patient is asymptomatic in the ED.  She has normal oxygen saturation and respiratory rate.  Evaluation and diagnostic testing in the emergency department does not suggest an emergent condition requiring admission or immediate intervention beyond what has been performed at this time.  The patient is safe for discharge and has been instructed to return immediately for worsening symptoms, change in symptoms or any other concerns. ?Discussed follow up with PCP while she is waiting for her cardiology appointment ? ? ? ? ? ? ? ?Final Clinical Impression(s) / ED Diagnoses ?Final diagnoses:  ?Dyspnea, unspecified type   ? ? ?Rx / DC Orders ?ED Discharge Orders   ? ? None  ? ?  ? ? ?  ?Dorie Rank, MD ?09/29/21 1921 ? ?

## 2021-10-03 DIAGNOSIS — J439 Emphysema, unspecified: Secondary | ICD-10-CM | POA: Diagnosis not present

## 2021-10-03 DIAGNOSIS — R0609 Other forms of dyspnea: Secondary | ICD-10-CM | POA: Diagnosis not present

## 2021-10-03 DIAGNOSIS — R600 Localized edema: Secondary | ICD-10-CM | POA: Diagnosis not present

## 2021-10-03 DIAGNOSIS — Z7689 Persons encountering health services in other specified circumstances: Secondary | ICD-10-CM | POA: Diagnosis not present

## 2021-10-05 DIAGNOSIS — R600 Localized edema: Secondary | ICD-10-CM | POA: Diagnosis not present

## 2021-10-20 ENCOUNTER — Ambulatory Visit: Payer: Medicare Other | Admitting: Student

## 2021-10-21 DIAGNOSIS — J069 Acute upper respiratory infection, unspecified: Secondary | ICD-10-CM | POA: Diagnosis not present

## 2021-11-10 ENCOUNTER — Emergency Department (HOSPITAL_COMMUNITY)
Admission: EM | Admit: 2021-11-10 | Discharge: 2021-11-11 | Disposition: A | Discharge status: Home / Self Care | Payer: Medicare Other | Attending: Emergency Medicine | Admitting: Emergency Medicine

## 2021-11-10 ENCOUNTER — Emergency Department (HOSPITAL_COMMUNITY): Payer: Medicare Other

## 2021-11-10 ENCOUNTER — Encounter (HOSPITAL_COMMUNITY): Payer: Self-pay | Admitting: Emergency Medicine

## 2021-11-10 DIAGNOSIS — R600 Localized edema: Secondary | ICD-10-CM | POA: Diagnosis not present

## 2021-11-10 DIAGNOSIS — R0602 Shortness of breath: Secondary | ICD-10-CM | POA: Diagnosis not present

## 2021-11-10 DIAGNOSIS — Z7982 Long term (current) use of aspirin: Secondary | ICD-10-CM | POA: Insufficient documentation

## 2021-11-10 DIAGNOSIS — Z79899 Other long term (current) drug therapy: Secondary | ICD-10-CM | POA: Insufficient documentation

## 2021-11-10 DIAGNOSIS — I471 Supraventricular tachycardia: Secondary | ICD-10-CM | POA: Insufficient documentation

## 2021-11-10 DIAGNOSIS — I10 Essential (primary) hypertension: Secondary | ICD-10-CM | POA: Diagnosis not present

## 2021-11-10 DIAGNOSIS — I4719 Other supraventricular tachycardia: Secondary | ICD-10-CM

## 2021-11-10 LAB — BASIC METABOLIC PANEL
Anion gap: 7 (ref 5–15)
BUN: 18 mg/dL (ref 8–23)
CO2: 23 mmol/L (ref 22–32)
Calcium: 9.3 mg/dL (ref 8.9–10.3)
Chloride: 109 mmol/L (ref 98–111)
Creatinine, Ser: 0.87 mg/dL (ref 0.44–1.00)
GFR, Estimated: >60 mL/min (ref >60)
Glucose, Bld: 119 mg/dL — ABNORMAL HIGH (ref 70–99)
Potassium: 3.4 mmol/L — ABNORMAL LOW (ref 3.5–5.1)
Sodium: 139 mmol/L (ref 135–145)

## 2021-11-10 LAB — CBC
HCT: 38.8 % (ref 36.0–46.0)
Hemoglobin: 13.1 g/dL (ref 12.0–15.0)
MCH: 31.2 pg (ref 26.0–34.0)
MCHC: 33.8 g/dL (ref 30.0–36.0)
MCV: 92.4 fL (ref 80.0–100.0)
Platelets: 265 10*3/uL (ref 150–400)
RBC: 4.2 MIL/uL (ref 3.87–5.11)
RDW: 13.2 % (ref 11.5–15.5)
WBC: 8.4 10*3/uL (ref 4.0–10.5)
nRBC: 0 % (ref 0.0–0.2)

## 2021-11-10 LAB — TROPONIN I (HIGH SENSITIVITY): Troponin I (High Sensitivity): 4 ng/L (ref <18)

## 2021-11-10 LAB — D-DIMER, QUANTITATIVE: D-Dimer, Quant: 0.77 ug/mL-FEU — ABNORMAL HIGH (ref 0.00–0.50)

## 2021-11-10 NOTE — ED Notes (Signed)
Patient transported to X-ray 

## 2021-11-10 NOTE — ED Provider Notes (Signed)
[facility] EMERGENCY DEPARTMENT Provider Note   CSN: [id number] Arrival date & time: 11/10/21  2141     History {Add pertinent medical, surgical, social history, OB history to HPI:1} Chief Complaint  Patient presents with   Shortness of Breath    Nancy Winters is a 76 y.o. female.  HPI     This is a 76 year old female who presents with shortness of breath.  Patient reports over the last 4 to 6 months she has had increasing worsening shortness of breath.  She states that she was diagnosed with "an enlarged heart in 2016" and since that time has had intermittent episodes of shortness of breath but they have become more frequent.  She was seen and evaluated in April of this year for the same but has been unable to follow-up with cardiology.  She has a cardiology appointment in July.  Today she had onset of shortness of breath associated with pain between the shoulder blades.  She denies any chest pain.  Symptoms were nonexertional in nature.  No recent fevers or cough.  She has not noted any significant lower extremity swelling.  Home Medications Prior to Admission medications   Medication Sig Start Date End Date Taking? Authorizing Provider  aspirin EC 81 MG EC tablet Take 1 tablet (81 mg total) by mouth daily. 02/14/14   Almyra Deforest, PA  esomeprazole (NEXIUM) 40 MG capsule Take 40 mg by mouth daily at 12 noon.    [provider]  levocetirizine (XYZAL) 5 MG tablet Take 5 mg by mouth every evening.    [provider]  losartan (COZAAR) 25 MG tablet Take 1 tablet (25 mg total) by mouth daily. 03/18/14   Lendon Colonel, NP  metoprolol tartrate (LOPRESSOR) 50 MG tablet Take 1 tablet by mouth 2 (two) times a day. 09/01/18   [provider]  Multiple Vitamins-Minerals (MULTIVITAMINS THER. W/MINERALS) TABS tablet Take 1 tablet by mouth daily.    [provider]  nitrofurantoin (MACRODANTIN) 50 MG capsule Take 50 mg by mouth at bedtime.    [provider]  nitroGLYCERIN (NITROSTAT) 0.4 MG SL tablet PLACE 1 TABLET UNDER THE TONGUE EVERY 5 MINUTES AS NEEDED FOR CHEST PAIN 07/08/15   Lendon Colonel, NP      Allergies    Ciprofloxacin    Review of Systems   Review of Systems  Respiratory:  Positive for shortness of breath. Negative for cough.   Cardiovascular:  Negative for chest pain.  All other systems reviewed and are negative.  Physical Exam Updated Vital Signs BP (!) 153/69   Pulse (!) 57   Temp 97.7 F (36.5 C) (Oral)   Resp 20   Ht 1.676 m ('5\' 6"'$ )   Wt 90.7 kg   SpO2 100%   BMI 32.28 kg/m  Physical Exam Vitals and nursing note reviewed.  Constitutional:      Appearance: She is well-developed. She is obese. She is not ill-appearing.  HENT:     Head: Normocephalic and atraumatic.  Eyes:     Pupils: Pupils are equal, round, and reactive to light.  Cardiovascular:     Rate and Rhythm: Normal rate and regular rhythm.     Heart sounds: Normal heart sounds.  Pulmonary:     Effort: Pulmonary effort is normal. No respiratory distress.     Breath sounds: No wheezing.  Abdominal:     General: Bowel sounds are normal.     Palpations: Abdomen is soft.  Musculoskeletal:  Cervical back: Neck supple.     Comments: Trace bilateral lower extremity edema  Skin:    General: Skin is warm and dry.  Neurological:     Mental Status: She is alert and oriented to person, place, and time.  Psychiatric:        Mood and Affect: Mood normal.    ED Results / Procedures / Treatments   Labs (all labs ordered are listed, but only abnormal results are displayed) Labs Reviewed  BASIC METABOLIC PANEL - Abnormal; Notable for the following components:      Result Value   Potassium 3.4 (*)    Glucose, Bld 119 (*)    All other components within normal limits  CBC  D-DIMER, QUANTITATIVE  TROPONIN I (HIGH SENSITIVITY)  TROPONIN I (HIGH SENSITIVITY)    EKG EKG Interpretation  Date/Time:  Tuesday Nov 10 2021 22:05:20  EDT Ventricular Rate:  77 PR Interval:  197 QRS Duration: 74 QT Interval:  466 QTC Calculation: 485 R Axis:   17 Text Interpretation: Sinus rhythm Paired ventricular premature complexes Low voltage, precordial leads Confirmed by Thayer Jew [id number]) on 11/10/2021 10:55:16 PM  Radiology DG Chest 2 View  Result Date: 11/10/2021 CLINICAL DATA:  Shortness of breath EXAM: CHEST - 2 VIEW COMPARISON:  09/29/2021 FINDINGS: Lungs are clear.  No pleural effusion or pneumothorax. The heart is normal in size. Visualized osseous structures are within normal limits. IMPRESSION: Normal chest radiographs. Electronically Signed   By: Julian Hy M.D.   On: 11/10/2021 22:23    Procedures Procedures  {Document cardiac monitor, telemetry assessment procedure when appropriate:1}  Medications Ordered in ED Medications - No data to display  ED Course/ Medical Decision Making/ A&P                           Medical Decision Making Amount and/or Complexity of Data Reviewed Labs: ordered. Radiology: ordered.   ***  {Document critical care time when appropriate:1} {Document review of labs and clinical decision tools ie heart score, Chads2Vasc2 etc:1}  {Document your independent review of radiology images, and any outside records:1} {Document your discussion with family members, caretakers, and with consultants:1} {Document social determinants of health affecting pt's care:1} {Document your decision making why or why not admission, treatments were needed:1} Final Clinical Impression(s) / ED Diagnoses Final diagnoses:  None    Rx / DC Orders ED Discharge Orders     None

## 2021-11-10 NOTE — ED Triage Notes (Signed)
Pt brought in POV c/o increased shortness of breath since 1930. Pt states she also noticed pain on her back between her shoulder blades.

## 2021-11-11 ENCOUNTER — Emergency Department (HOSPITAL_COMMUNITY): Payer: Medicare Other

## 2021-11-11 DIAGNOSIS — R0602 Shortness of breath: Secondary | ICD-10-CM | POA: Diagnosis not present

## 2021-11-11 DIAGNOSIS — I471 Supraventricular tachycardia: Secondary | ICD-10-CM | POA: Diagnosis not present

## 2021-11-11 LAB — TROPONIN I (HIGH SENSITIVITY): Troponin I (High Sensitivity): 4 ng/L (ref <18)

## 2021-11-11 MED ORDER — METOPROLOL TARTRATE 5 MG/5ML IV SOLN
5.0000 mg | Freq: Once | INTRAVENOUS | Status: AC
  Administered 2021-11-11: 5 mg via INTRAVENOUS
  Filled 2021-11-11: qty 5

## 2021-11-11 MED ORDER — POTASSIUM CHLORIDE CRYS ER 20 MEQ PO TBCR
40.0000 meq | EXTENDED_RELEASE_TABLET | Freq: Once | ORAL | Status: AC
  Administered 2021-11-11: 40 meq via ORAL
  Filled 2021-11-11: qty 2

## 2021-11-11 MED ORDER — IOHEXOL 350 MG/ML SOLN
75.0000 mL | Freq: Once | INTRAVENOUS | Status: AC | PRN
  Administered 2021-11-11: 75 mL via INTRAVENOUS

## 2021-11-11 NOTE — Discharge Instructions (Signed)
You were seen today for shortness of breath.  Your work-up was reassuring.  You were noted to have a higher heart rate at 1 time.  Follow-up closely with your cardiologist.  A referral has been sent.  If you have new or worsening symptoms, you should be reevaluated.

## 2021-11-13 ENCOUNTER — Other Ambulatory Visit (HOSPITAL_COMMUNITY): Payer: Self-pay | Admitting: Nurse Practitioner

## 2021-11-13 DIAGNOSIS — R0600 Dyspnea, unspecified: Secondary | ICD-10-CM

## 2021-11-19 ENCOUNTER — Ambulatory Visit: Payer: Medicare Other | Admitting: Cardiology

## 2021-11-23 DIAGNOSIS — R7303 Prediabetes: Secondary | ICD-10-CM | POA: Diagnosis not present

## 2021-11-23 DIAGNOSIS — I10 Essential (primary) hypertension: Secondary | ICD-10-CM | POA: Diagnosis not present

## 2021-11-23 DIAGNOSIS — R7301 Impaired fasting glucose: Secondary | ICD-10-CM | POA: Diagnosis not present

## 2021-11-26 ENCOUNTER — Ambulatory Visit (HOSPITAL_COMMUNITY)
Admission: RE | Admit: 2021-11-26 | Discharge: 2021-11-26 | Disposition: A | Discharge status: Home / Self Care | Payer: Medicare Other | Source: Ambulatory Visit | Attending: Nurse Practitioner | Admitting: Nurse Practitioner

## 2021-11-26 DIAGNOSIS — R0600 Dyspnea, unspecified: Secondary | ICD-10-CM | POA: Diagnosis not present

## 2021-11-26 LAB — ECHOCARDIOGRAM COMPLETE
Area-P 1/2: 3.6 cm2
S' Lateral: 2.6 cm

## 2021-11-26 NOTE — Progress Notes (Signed)
*  PRELIMINARY RESULTS* Echocardiogram 2D Echocardiogram has been performed.  Nancy Winters 11/26/2021, 2:36 PM

## 2021-11-30 DIAGNOSIS — Z6832 Body mass index (BMI) 32.0-32.9, adult: Secondary | ICD-10-CM | POA: Diagnosis not present

## 2021-11-30 DIAGNOSIS — R0609 Other forms of dyspnea: Secondary | ICD-10-CM | POA: Diagnosis not present

## 2021-11-30 DIAGNOSIS — I2510 Atherosclerotic heart disease of native coronary artery without angina pectoris: Secondary | ICD-10-CM | POA: Diagnosis not present

## 2021-11-30 DIAGNOSIS — I10 Essential (primary) hypertension: Secondary | ICD-10-CM | POA: Diagnosis not present

## 2021-11-30 DIAGNOSIS — R000 Tachycardia, unspecified: Secondary | ICD-10-CM | POA: Diagnosis not present

## 2021-11-30 DIAGNOSIS — R7303 Prediabetes: Secondary | ICD-10-CM | POA: Diagnosis not present

## 2021-11-30 DIAGNOSIS — J439 Emphysema, unspecified: Secondary | ICD-10-CM | POA: Diagnosis not present

## 2021-11-30 DIAGNOSIS — E669 Obesity, unspecified: Secondary | ICD-10-CM | POA: Diagnosis not present

## 2021-11-30 DIAGNOSIS — E785 Hyperlipidemia, unspecified: Secondary | ICD-10-CM | POA: Diagnosis not present

## 2022-01-03 NOTE — Progress Notes (Unsigned)
Cardiology Office Note   Date:  01/04/2022   ID:  Nancy Winters, Nancy Winters 1945-12-04, MRN [id number]  PCP:  Celene Squibb, MD  Cardiologist:  Dr. Domenic Polite    Chief Complaint  Patient presents with   Shortness of Breath      History of Present Illness: Nancy Winters is a 76 y.o. female who presents for stress induced cardiomyopathy with normalization of EF in 2015, HTN, and non obstructive CAD in 2015 (20% LAD), HLD.   Echo 2020 with normal EF 60-65%, small pericardial effusion.    More recently with SOB in April this year.  CTA chest neg PE, with follow up ER visit. Repeat ER visit May this year. At that visit DOE and pain between the shoulder blades.  No chest pain. Troponins have all been neg. Neg BNP CBC and BMP stable.  Echo repeated and EF 60-65%, G2DD.  RV is normal. Small pericardial effusion.  Valves with no acute issues.  Does have emphysema.  Had atrial tach, not atrial fib and K+ replaced and a dose of BB IV given and HR improved.   Today she still has DOE  she does less and less with more dyspnea.  Mild tingling now on lt side.  We discussed stress test, she does not believe she can walk on treadmill.  No awareness of rapid HR but she was not aware in ER.  She was not aware of emphysema, does have Lungs/Pleura: Biapical pleural-parenchymal scarring.  Mild subpleural reticulation/fibrosis in the lingula and bilateral lower lobes, possibly post infectious inflammatory.  She never smoked but she was exposed to her husbands smoking.      Past Medical History:  Diagnosis Date   Coronary atherosclerosis    Mild, nonobstructive August 2015   Essential hypertension, benign    Female bladder prolapse    GERD (gastroesophageal reflux disease)    Takotsubo syndrome 02/13/2014   Echo 02/13/2014 LVEF 40%    Past Surgical History:  Procedure Laterality Date   BREAST CYST EXCISION Left 1979   BREAST SURGERY Left 2011   Ducts removed   LEFT HEART CATHETERIZATION WITH CORONARY  ANGIOGRAM N/A 02/12/2014   Procedure: LEFT HEART CATHETERIZATION WITH CORONARY ANGIOGRAM;  Surgeon: Troy Sine, MD;  Location: [facility] CATH LAB;  Service: Cardiovascular;  Laterality: N/A;   TONSILLECTOMY     TUBAL LIGATION       Current Outpatient Medications  Medication Sig Dispense Refill   aspirin EC 81 MG EC tablet Take 1 tablet (81 mg total) by mouth daily.     CALCIUM-MAGNESIUM-ZINC PO Take by mouth.     co-enzyme Q-10 30 MG capsule Take 30 mg by mouth 3 (three) times daily.     esomeprazole (NEXIUM) 40 MG capsule Take 40 mg by mouth daily at 12 noon.     gabapentin (NEURONTIN) 100 MG capsule Take 100 mg by mouth at bedtime.     KRILL OIL PO Take by mouth.     levocetirizine (XYZAL) 5 MG tablet Take 5 mg by mouth every evening.     losartan (COZAAR) 25 MG tablet Take 1 tablet (25 mg total) by mouth daily. 90 tablet 3   metoprolol tartrate (LOPRESSOR) 50 MG tablet Take 1 tablet by mouth 2 (two) times a day.     Multiple Vitamins-Minerals (MULTIVITAMINS THER. W/MINERALS) TABS tablet Take 1 tablet by mouth daily.     nitroGLYCERIN (NITROSTAT) 0.4 MG SL tablet PLACE 1 TABLET UNDER THE TONGUE EVERY 5 MINUTES  AS NEEDED FOR CHEST PAIN 25 tablet 3   Red Yeast Rice Extract (RED YEAST RICE PO) Take by mouth.     nitrofurantoin (MACRODANTIN) 50 MG capsule Take 50 mg by mouth at bedtime. (Patient not taking: Reported on 01/04/2022)     No current facility-administered medications for this visit.    Allergies:   Ciprofloxacin    Social History:  The patient  reports that she has never smoked. She has never used smokeless tobacco. She reports that she does not drink alcohol and does not use drugs.   Family History:  The patient's family history includes Cancer in her father and mother; HIV/AIDS in her brother.    ROS:  General:no colds or fevers, no weight changes Skin:no rashes or ulcers HEENT:no blurred vision, no congestion CV:see HPI PUL:see HPI GI:no diarrhea constipation or melena,  no indigestion GU:no hematuria, no dysuria MS:no joint pain, no claudication Neuro:no syncope, no lightheadedness Endo:no diabetes, no thyroid disease  Wt Readings from Last 3 Encounters:  01/04/22 197 lb 6.4 oz (89.5 kg)  11/10/21 200 lb (90.7 kg)  09/29/21 190 lb (86.2 kg)     PHYSICAL EXAM: VS:  BP 140/78   Pulse (!) 55   Ht '5\' 6"'$  (1.676 m)   Wt 197 lb 6.4 oz (89.5 kg)   SpO2 99%   BMI 31.86 kg/m  , BMI Body mass index is 31.86 kg/m. General:Pleasant affect, NAD Skin:Warm and dry, brisk capillary refill HEENT:normocephalic, sclera clear, mucus membranes moist Neck:supple, no JVD, no bruits  Heart:S1S2 RRR without murmur, gallup, rub or click Lungs:clear without rales, rhonchi, or wheezes ZOX:WRUE, non tender, + BS, do not palpate liver spleen or masses Ext:no lower ext edema, 2+ pedal pulses, 2+ radial pulses Neuro:alert and oriented, MAE, follows commands, + facial symmetry    EKG:  EKG is NOT ordered today. The ekg from ER without acute ST changes, she did have atrial tach. I personally reviewed.   Recent Labs: 09/29/2021: B Natriuretic Peptide 12.0 11/10/2021: BUN 18; Creatinine, Ser 0.87; Hemoglobin 13.1; Platelets 265; Potassium 3.4; Sodium 139    Lipid Panel    Component Value Date/Time   CHOL 168 02/12/2014 0215   TRIG 82 02/12/2014 0215   HDL 64 02/12/2014 0215   CHOLHDL 2.6 02/12/2014 0215   VLDL 16 02/12/2014 0215   LDLCALC 88 02/12/2014 0215       Other studies Reviewed: Additional studies/ records that were reviewed today include:   Echo 11/26/21. IMPRESSIONS     1. Left ventricular ejection fraction, by estimation, is 60 to 65%. The  left ventricle has normal function. The left ventricle has no regional  wall motion abnormalities. Left ventricular diastolic parameters are  consistent with Grade II diastolic  dysfunction (pseudonormalization). Elevated left ventricular end-diastolic  pressure.   2. Right ventricular systolic function is  normal. The right ventricular  size is normal. Tricuspid regurgitation signal is inadequate for assessing  PA pressure.   3. Left atrial size was mildly dilated.   4. A small pericardial effusion is present. The pericardial effusion is  circumferential.   5. The mitral valve is grossly normal. Trivial mitral valve  regurgitation.   6. The aortic valve is tricuspid. There is mild calcification of the  aortic valve. Aortic valve regurgitation is trivial. Aortic valve  sclerosis is present, with no evidence of aortic valve stenosis.   7. The inferior vena cava is normal in size with greater than 50%  respiratory variability, suggesting right atrial pressure  of 3 mmHg.   Comparison(s): Prior images reviewed side by side. LVEF remains normal  range with moderate diastolic dysfunction.   FINDINGS   Left Ventricle: Left ventricular ejection fraction, by estimation, is 60  to 65%. The left ventricle has normal function. The left ventricle has no  regional wall motion abnormalities. The left ventricular internal cavity  size was normal in size. There is   no left ventricular hypertrophy. Left ventricular diastolic parameters  are consistent with Grade II diastolic dysfunction (pseudonormalization).  Elevated left ventricular end-diastolic pressure.   Right Ventricle: The right ventricular size is normal. No increase in  right ventricular wall thickness. Right ventricular systolic function is  normal. Tricuspid regurgitation signal is inadequate for assessing PA  pressure.   Left Atrium: Left atrial size was mildly dilated.   Right Atrium: Right atrial size was normal in size.   Pericardium: A small pericardial effusion is present. The pericardial  effusion is circumferential.   Mitral Valve: The mitral valve is grossly normal. Trivial mitral valve  regurgitation.   Tricuspid Valve: The tricuspid valve is grossly normal. Tricuspid valve  regurgitation is trivial.   Aortic Valve: The  aortic valve is tricuspid. There is mild calcification  of the aortic valve. There is mild aortic valve annular calcification.  Aortic valve regurgitation is trivial. Aortic valve sclerosis is present,  with no evidence of aortic valve  stenosis.   Pulmonic Valve: The pulmonic valve was grossly normal. Pulmonic valve  regurgitation is trivial.   Aorta: The aortic root is normal in size and structure.   Venous: The inferior vena cava is normal in size with greater than 50%  respiratory variability, suggesting right atrial pressure of 3 mmHg.   IAS/Shunts: No atrial level shunt detected by color flow Doppler.   ASSESSMENT AND PLAN:  1.  DOE none at rest.  Some Lt lower chest tingling.  Last cath in 2016 non ischemic.  Will check lexiscan myoview with increasing dyspnea and now some chest tingling.  Follow up in 4 months unless + then will see earlier. Her echo with normal EF.  Valves stable.  Mild pericardial effusion, she has had in past. Continue ASA  2.  Atrial tach.  Could be cause, will ask her to wear a monitor for 30 days to eval.    3.  HTN borderline will increase losartan to 50 mg to see if this helps with dyspnea, better BP control. Check BMP in 1 month.  Labs from ER visits reviewed with normal cr.     4.  Hx of HLD on red rice yeast and krill oil  followed by PCP   Current medicines are reviewed with the patient today.  The patient Has no concerns regarding medicines.  The following changes have been made:  See above Labs/ tests ordered today include:see above  Disposition:   FU:  see above  Signed, Cecilie Kicks, NP  01/04/2022 1:17 PM    [facility] [address], [place], [place], [place] Phone: [phone]; Fax: [phone]

## 2022-01-04 ENCOUNTER — Encounter: Payer: Self-pay | Admitting: Cardiology

## 2022-01-04 ENCOUNTER — Ambulatory Visit (INDEPENDENT_AMBULATORY_CARE_PROVIDER_SITE_OTHER): Payer: Medicare Other | Admitting: Cardiology

## 2022-01-04 VITALS — BP 140/78 | HR 55 | Ht 66.0 in | Wt 197.4 lb

## 2022-01-04 DIAGNOSIS — E7800 Pure hypercholesterolemia, unspecified: Secondary | ICD-10-CM

## 2022-01-04 DIAGNOSIS — I10 Essential (primary) hypertension: Secondary | ICD-10-CM

## 2022-01-04 DIAGNOSIS — I4719 Other supraventricular tachycardia: Secondary | ICD-10-CM

## 2022-01-04 DIAGNOSIS — R0609 Other forms of dyspnea: Secondary | ICD-10-CM

## 2022-01-04 DIAGNOSIS — I471 Supraventricular tachycardia: Secondary | ICD-10-CM | POA: Diagnosis not present

## 2022-01-04 MED ORDER — LOSARTAN POTASSIUM 50 MG PO TABS: 50.0000 mg | ORAL_TABLET | Freq: Every day | ORAL | 3 refills | Status: DC

## 2022-01-04 NOTE — Patient Instructions (Signed)
Medication Instructions:  Your physician has recommended you make the following change in your medication:  Increase Losartan to 50 mg tablets daily   Labwork: In 2 weeks: -BMET  Testing/Procedures: Your physician has requested that you have a lexiscan myoview. For further information please visit [email or web address]. Please follow instruction sheet, as given.   Follow-Up: Follow up with Dr. Domenic Polite or APP in 2 months.   Any Other Special Instructions Will Be Listed Below (If Applicable).     If you need a refill on your cardiac medications before your next appointment, please call your pharmacy.

## 2022-01-11 ENCOUNTER — Ambulatory Visit (HOSPITAL_COMMUNITY)
Admission: RE | Admit: 2022-01-11 | Discharge: 2022-01-11 | Disposition: A | Discharge status: Home / Self Care | Payer: Medicare Other | Source: Ambulatory Visit | Attending: Cardiology | Admitting: Cardiology

## 2022-01-11 ENCOUNTER — Telehealth: Payer: Self-pay

## 2022-01-11 ENCOUNTER — Encounter (HOSPITAL_COMMUNITY)
Admission: RE | Admit: 2022-01-11 | Discharge: 2022-01-11 | Disposition: A | Discharge status: Home / Self Care | Payer: Medicare Other | Source: Ambulatory Visit | Attending: Cardiology | Admitting: Cardiology

## 2022-01-11 ENCOUNTER — Encounter (HOSPITAL_COMMUNITY): Payer: Medicare Other

## 2022-01-11 DIAGNOSIS — R0609 Other forms of dyspnea: Secondary | ICD-10-CM | POA: Diagnosis not present

## 2022-01-11 LAB — NM MYOCAR MULTI W/SPECT W/WALL MOTION / EF
LV dias vol: 41 mL (ref 46–106)
LV sys vol: 8 mL
Nuc Stress EF: 82 %
Peak HR: 83 {beats}/min
RATE: 0.4
Rest HR: 59 {beats}/min
Rest Nuclear Isotope Dose: 10.9 mCi
SDS: 0
SRS: 0
SSS: 0
ST Depression (mm): 0 mm
Stress Nuclear Isotope Dose: 30 mCi
TID: 0.87

## 2022-01-11 MED ORDER — TECHNETIUM TC 99M TETROFOSMIN IV KIT
10.0000 | PACK | Freq: Once | INTRAVENOUS | Status: AC | PRN
  Administered 2022-01-11: 10.94 via INTRAVENOUS

## 2022-01-11 MED ORDER — SODIUM CHLORIDE FLUSH 0.9 % IV SOLN
INTRAVENOUS | Status: AC
  Administered 2022-01-11: 10 mL via INTRAVENOUS
  Filled 2022-01-11: qty 10

## 2022-01-11 MED ORDER — REGADENOSON 0.4 MG/5ML IV SOLN
INTRAVENOUS | Status: AC
  Administered 2022-01-11: 0.4 mg via INTRAVENOUS
  Filled 2022-01-11: qty 5

## 2022-01-11 MED ORDER — TECHNETIUM TC 99M TETROFOSMIN IV KIT
30.0000 | PACK | Freq: Once | INTRAVENOUS | Status: AC | PRN
  Administered 2022-01-11: 30 via INTRAVENOUS

## 2022-01-11 NOTE — Telephone Encounter (Signed)
Patient notified and verbalized understanding. Pt stated that she received her monitor today. PCP copied.

## 2022-01-11 NOTE — Telephone Encounter (Signed)
-----   Message from Isaiah Serge, NP sent at 01/11/2022  2:09 PM EDT ----- Good news, no lack of blood supply to heart to cause shortness of breath.  Once we get monitor back if no arrhythmias to explain then will refer to pulmonary Cecilie Kicks, FNP-C

## 2022-01-14 DIAGNOSIS — R000 Tachycardia, unspecified: Secondary | ICD-10-CM | POA: Diagnosis not present

## 2022-01-15 ENCOUNTER — Ambulatory Visit: Payer: Medicare Other

## 2022-01-15 DIAGNOSIS — I471 Supraventricular tachycardia: Secondary | ICD-10-CM

## 2022-01-26 ENCOUNTER — Encounter: Payer: Medicare Other | Admitting: Nutrition

## 2022-01-30 ENCOUNTER — Telehealth: Payer: Self-pay | Admitting: Physician Assistant

## 2022-01-30 NOTE — Telephone Encounter (Signed)
   Cardiac Monitor Alert  Date of alert:  01/30/2022   Patient Name: Nancy Winters  DOB: April 17, 1946  MRN: [id number]   [facility] Cardiologist: Rozann Lesches, MD  [facility] EP:  None    Monitor Information: Cardiac Event Monitor [[employer]]  Reason:  ATach Ordering provider:  [facility]   Alert Ventricular Tachycardia This is the 1st alert for this rhythm.   Next Cardiology Appointment   Date:  02/26/22  Provider:  Domenic Polite  The patient was contacted today.  She is symptomatic.  She reports the following symptoms:  shortness of breath. Arrhythmia, symptoms and history reviewed with Dr. Lovena Le. It is difficult to tell if this was VT or ATach. But, episode is brief. Pt has had symptoms like this for years. Recent echocardiogram with normal EF.     Plan:  Continue to monitor. I recommended that she take an extra Metoprolol 25 mg if needed for prolonged palpitations.   Other:    Richardson Dopp, PA-C  01/30/2022 4:49 PM

## 2022-02-01 DIAGNOSIS — N904 Leukoplakia of vulva: Secondary | ICD-10-CM | POA: Diagnosis not present

## 2022-02-01 DIAGNOSIS — N8111 Cystocele, midline: Secondary | ICD-10-CM | POA: Diagnosis not present

## 2022-02-01 DIAGNOSIS — M8580 Other specified disorders of bone density and structure, unspecified site: Secondary | ICD-10-CM | POA: Diagnosis not present

## 2022-02-01 DIAGNOSIS — Z1212 Encounter for screening for malignant neoplasm of rectum: Secondary | ICD-10-CM | POA: Diagnosis not present

## 2022-02-01 DIAGNOSIS — Z780 Asymptomatic menopausal state: Secondary | ICD-10-CM | POA: Diagnosis not present

## 2022-02-16 DIAGNOSIS — Z1231 Encounter for screening mammogram for malignant neoplasm of breast: Secondary | ICD-10-CM | POA: Diagnosis not present

## 2022-02-26 ENCOUNTER — Encounter: Payer: Self-pay | Admitting: Cardiology

## 2022-02-26 ENCOUNTER — Ambulatory Visit: Payer: Medicare Other | Attending: Cardiology | Admitting: Cardiology

## 2022-02-26 VITALS — BP 140/70 | HR 54 | Ht 66.0 in | Wt 203.6 lb

## 2022-02-26 DIAGNOSIS — Z8679 Personal history of other diseases of the circulatory system: Secondary | ICD-10-CM | POA: Diagnosis not present

## 2022-02-26 DIAGNOSIS — I10 Essential (primary) hypertension: Secondary | ICD-10-CM | POA: Insufficient documentation

## 2022-02-26 DIAGNOSIS — R0609 Other forms of dyspnea: Secondary | ICD-10-CM | POA: Diagnosis not present

## 2022-02-26 DIAGNOSIS — Z79899 Other long term (current) drug therapy: Secondary | ICD-10-CM

## 2022-02-26 MED ORDER — SPIRONOLACTONE 25 MG PO TABS: 12.5000 mg | ORAL_TABLET | Freq: Every day | ORAL | 1 refills | Status: DC

## 2022-02-26 NOTE — Progress Notes (Signed)
Cardiology Office Note  Date: 02/26/2022   ID: Nancy Winters, Nancy Winters July 03, 1945, MRN [id number]  PCP:  Celene Squibb, MD  Cardiologist:  Rozann Lesches, MD Electrophysiologist:  None   Chief Complaint  Patient presents with   Cardiac follow-up    History of Present Illness: Nancy Winters is a 76 y.o. female last seen by [name] in July, I reviewed the note (I last assessed her in 2020).  She presents for follow-up today.  Reports good days and bad days in terms of shortness of breath.  Today is a good day.  She does not have consistent exertional chest discomfort, no sense of palpitations or syncope.  I reviewed interval cardiac testing including echocardiogram, Lexiscan Myoview, and her cardiac monitor.  Overall reassuring results with no evidence of ischemia to suggest obstructive CAD.  LVEF remains normal although she does have moderate diastolic dysfunction which could be a contributor.  No major valvular abnormalities.  No distinct rhythm abnormalities to specifically require treatment at this time.  Her resting heart rate is fairly low she is already on Lopressor.  Cozaar has already been uptitrated.  We discussed addition of low-dose diuretic.  Past Medical History:  Diagnosis Date   Coronary atherosclerosis    Mild, nonobstructive August 2015   Essential hypertension, benign    Female bladder prolapse    GERD (gastroesophageal reflux disease)    Takotsubo syndrome 02/13/2014   Echo 02/13/2014 LVEF 40%    Past Surgical History:  Procedure Laterality Date   BREAST CYST EXCISION Left 1979   BREAST SURGERY Left 2011   Ducts removed   LEFT HEART CATHETERIZATION WITH CORONARY ANGIOGRAM N/A 02/12/2014   Procedure: LEFT HEART CATHETERIZATION WITH CORONARY ANGIOGRAM;  Surgeon: Troy Sine, MD;  Location: [facility] CATH LAB;  Service: Cardiovascular;  Laterality: N/A;   TONSILLECTOMY     TUBAL LIGATION      Current Outpatient Medications  Medication Sig Dispense Refill    aspirin EC 81 MG EC tablet Take 1 tablet (81 mg total) by mouth daily.     CALCIUM-MAGNESIUM-ZINC PO Take by mouth.     co-enzyme Q-10 30 MG capsule Take 30 mg by mouth 3 (three) times daily.     esomeprazole (NEXIUM) 40 MG capsule Take 40 mg by mouth daily at 12 noon.     gabapentin (NEURONTIN) 100 MG capsule Take 100 mg by mouth at bedtime.     KRILL OIL PO Take by mouth.     levocetirizine (XYZAL) 5 MG tablet Take 5 mg by mouth every evening.     losartan (COZAAR) 50 MG tablet Take 1 tablet (50 mg total) by mouth daily. 90 tablet 3   metoprolol tartrate (LOPRESSOR) 50 MG tablet Take 1 tablet by mouth 2 (two) times a day.     Multiple Vitamins-Minerals (MULTIVITAMINS THER. W/MINERALS) TABS tablet Take 1 tablet by mouth daily.     nitroGLYCERIN (NITROSTAT) 0.4 MG SL tablet PLACE 1 TABLET UNDER THE TONGUE EVERY 5 MINUTES AS NEEDED FOR CHEST PAIN 25 tablet 3   Red Yeast Rice Extract (RED YEAST RICE PO) Take by mouth.     spironolactone (ALDACTONE) 25 MG tablet Take 0.5 tablets (12.5 mg total) by mouth daily. 45 tablet 1   No current facility-administered medications for this visit.   Allergies:  Ciprofloxacin   ROS: No sudden dizziness or syncope.  Physical Exam: VS:  BP (!) 140/70 (BP Location: Left Arm, Patient Position: Sitting, Cuff Size: Large)  Pulse (!) 54   Ht '5\' 6"'$  (1.676 m)   Wt 203 lb 9.6 oz (92.4 kg)   SpO2 96%   BMI 32.86 kg/m , BMI Body mass index is 32.86 kg/m.  Wt Readings from Last 3 Encounters:  02/26/22 203 lb 9.6 oz (92.4 kg)  01/04/22 197 lb 6.4 oz (89.5 kg)  11/10/21 200 lb (90.7 kg)    General: Patient appears comfortable at rest. HEENT: Conjunctiva and lids normal. Neck: Supple, no elevated JVP or carotid bruits. Lungs: Clear to auscultation, nonlabored breathing at rest. Cardiac: Regular rate and rhythm, no S3 or significant systolic murmur, no pericardial rub. Extremities: No pitting edema.  ECG:  An ECG dated 11/10/2021 was personally reviewed  today and demonstrated:  Sinus rhythm with PVC.  Nonspecific T wave changes.  Recent Labwork: 09/29/2021: B Natriuretic Peptide 12.0 11/10/2021: BUN 18; Creatinine, Ser 0.87; Hemoglobin 13.1; Platelets 265; Potassium 3.4; Sodium 139     Component Value Date/Time   CHOL 168 02/12/2014 0215   TRIG 82 02/12/2014 0215   HDL 64 02/12/2014 0215   CHOLHDL 2.6 02/12/2014 0215   VLDL 16 02/12/2014 0215   LDLCALC 88 02/12/2014 0215    Other Studies Reviewed Today:  Echocardiogram 11/26/2021:  1. Left ventricular ejection fraction, by estimation, is 60 to 65%. The  left ventricle has normal function. The left ventricle has no regional  wall motion abnormalities. Left ventricular diastolic parameters are  consistent with Grade II diastolic  dysfunction (pseudonormalization). Elevated left ventricular end-diastolic  pressure.   2. Right ventricular systolic function is normal. The right ventricular  size is normal. Tricuspid regurgitation signal is inadequate for assessing  PA pressure.   3. Left atrial size was mildly dilated.   4. A small pericardial effusion is present. The pericardial effusion is  circumferential.   5. The mitral valve is grossly normal. Trivial mitral valve  regurgitation.   6. The aortic valve is tricuspid. There is mild calcification of the  aortic valve. Aortic valve regurgitation is trivial. Aortic valve  sclerosis is present, with no evidence of aortic valve stenosis.   7. The inferior vena cava is normal in size with greater than 50%  respiratory variability, suggesting right atrial pressure of 3 mmHg.   Lexiscan Myoview 01/11/2022:   The study is normal. The study is low risk.   The ECG was negative for ischemia.   LV perfusion is normal.   Left ventricular function is normal. Nuclear stress EF: 82 %.   Low risk study with no significant ischemia and LVEF 82%.  Cardiac monitor in August 2023: Predominant rhythm is sinus with prolonged PR interval, heart rate  ranging from 48 bpm up to 129 bpm and average heart rate 69 bpm.  There were occasional PACs representing 1% total beats.  Rare PVCs representing less than 1% total beats.  Possible episode of limited atrial fibrillation noted on August 12 (although this could be an ectopic atrial tachycardia or artifact), also 9-second run of NSVT versus aberrant atrial tachycardia on the same day.  No pauses.  Assessment and Plan:  1.  Intermittent dyspnea on exertion.  LVEF is normal at 60 to 65%.  She does have moderate diastolic dysfunction which could be a contributor.  Also with possible intermittent ectopic atrial tachycardia per review of monitor and her ECGs from May.  With low resting heart rate, plan to continue present dose of Lopressor.  We will add Aldactone 12.5 mg daily to current dose of losartan.  Importantly, she had no evidence of ischemia by interval Lexiscan Myoview.  Check BMET in 2 weeks.  Follow-up arranged.  2.  Essential hypertension, on losartan 50 mg daily, adding Aldactone as well.  3.  Previously documented mild coronary atherosclerosis, no ischemia by recent follow-up Lexiscan Myoview.  She is on aspirin and red yeast rice at this time.  Medication Adjustments/Labs and Tests Ordered: Current medicines are reviewed at length with the patient today.  Concerns regarding medicines are outlined above.   Tests Ordered: Orders Placed This Encounter  Procedures   Basic metabolic panel    Medication Changes: Meds ordered this encounter  Medications   spironolactone (ALDACTONE) 25 MG tablet    Sig: Take 0.5 tablets (12.5 mg total) by mouth daily.    Dispense:  45 tablet    Refill:  1    02/26/2022 NEW    Disposition:  Follow up  3 months.  Signed, [name], MD, [facility] 02/26/2022 5:12 PM    [facility] [place], [place], [place] [address] Phone: [phone]; Fax: [phone]

## 2022-02-26 NOTE — Patient Instructions (Addendum)
Medication Instructions:  Your physician has recommended you make the following change in your medication:  Start spironolactone 12.5 mg daily Continue other medications the same  Labwork: BMET in 2 weeks around 03/12/2022 Lab [employer] or [facility]  Testing/Procedures: none  Follow-Up: Your physician recommends that you schedule a follow-up appointment in: 3 months  Any Other Special Instructions Will Be Listed Below (If Applicable).  If you need a refill on your cardiac medications before your next appointment, please call your pharmacy.

## 2022-03-22 DIAGNOSIS — R0609 Other forms of dyspnea: Secondary | ICD-10-CM | POA: Diagnosis not present

## 2022-03-22 DIAGNOSIS — Z79899 Other long term (current) drug therapy: Secondary | ICD-10-CM | POA: Diagnosis not present

## 2022-03-23 LAB — BASIC METABOLIC PANEL
BUN/Creatinine Ratio: 20 (ref 12–28)
BUN: 19 mg/dL (ref 8–27)
CO2: 21 mmol/L (ref 20–29)
Calcium: 10 mg/dL (ref 8.7–10.3)
Chloride: 104 mmol/L (ref 96–106)
Creatinine, Ser: 0.95 mg/dL (ref 0.57–1.00)
Glucose: 121 mg/dL — ABNORMAL HIGH (ref 70–99)
Potassium: 4.7 mmol/L (ref 3.5–5.2)
Sodium: 141 mmol/L (ref 134–144)
eGFR: 62 mL/min/{1.73_m2} (ref >59)

## 2022-04-14 DIAGNOSIS — B351 Tinea unguium: Secondary | ICD-10-CM | POA: Diagnosis not present

## 2022-05-04 DIAGNOSIS — H26493 Other secondary cataract, bilateral: Secondary | ICD-10-CM | POA: Diagnosis not present

## 2022-05-04 DIAGNOSIS — H43813 Vitreous degeneration, bilateral: Secondary | ICD-10-CM | POA: Diagnosis not present

## 2022-05-25 ENCOUNTER — Encounter: Payer: Self-pay | Admitting: Cardiology

## 2022-05-25 ENCOUNTER — Ambulatory Visit: Payer: Medicare Other | Attending: Cardiology | Admitting: Cardiology

## 2022-05-25 VITALS — BP 100/60 | HR 60 | Ht 66.0 in | Wt 204.8 lb

## 2022-05-25 DIAGNOSIS — R0609 Other forms of dyspnea: Secondary | ICD-10-CM

## 2022-05-25 DIAGNOSIS — I10 Essential (primary) hypertension: Secondary | ICD-10-CM | POA: Diagnosis not present

## 2022-05-25 MED ORDER — SPIRONOLACTONE 25 MG PO TABS: 12.5000 mg | ORAL_TABLET | Freq: Every day | ORAL | 3 refills | Status: DC

## 2022-05-25 NOTE — Patient Instructions (Addendum)
Medication Instructions:  Continue all current medications.   Labwork: none  Testing/Procedures: none  Follow-Up: Your physician recommends that you schedule a follow-up appointment in:  6 months   Any Other Special Instructions Will Be Listed Below (If Applicable).  If you need a refill on your cardiac medications before your next appointment, please call your pharmacy.

## 2022-05-25 NOTE — Progress Notes (Signed)
Cardiology Office Note  Date: 05/25/2022   ID: Nancy, Winters 12-10-1945, MRN [id number]  PCP:  Celene Squibb, MD  Cardiologist:  Rozann Lesches, MD Electrophysiologist:  None   Chief Complaint  Patient presents with   Cardiac follow-up    History of Present Illness: Nancy Winters is a 76 y.o. female last seen in September.  She is here for a routine visit.  Reports no change in shortness of breath, overall NYHA class II with most activities and importantly no exertional chest tightness.  We did talk about a walking plan for exercise.  She is tolerating the interval addition of Aldactone and her lab work was stable thereafter.  Pending visit with Dr. Nevada Crane and repeat lab work noted.  She has a physical soon.  Past Medical History:  Diagnosis Date   Coronary atherosclerosis    Mild, nonobstructive August 2015   Essential hypertension, benign    Female bladder prolapse    GERD (gastroesophageal reflux disease)    Takotsubo syndrome 02/13/2014   Echo 02/13/2014 LVEF 40%    Past Surgical History:  Procedure Laterality Date   BREAST CYST EXCISION Left 1979   BREAST SURGERY Left 2011   Ducts removed   LEFT HEART CATHETERIZATION WITH CORONARY ANGIOGRAM N/A 02/12/2014   Procedure: LEFT HEART CATHETERIZATION WITH CORONARY ANGIOGRAM;  Surgeon: Troy Sine, MD;  Location[facility] C[facility] LAB;  Service: Cardiovascular;  Laterality: N/A;   TONSILLECTOMY     TUBAL LIGATION      Current Outpatient Medications  Medication Sig Dispense Refill   aspirin EC 81 MG EC tablet Take 1 tablet (81 mg total) by mouth daily.     CALCIUM-MAGNESIUM-ZINC PO Take by mouth.     co-enzyme Q-10 30 MG capsule Take 30 mg by mouth 3 (three) times daily.     esomeprazole (NEXIUM) 40 MG capsule Take 40 mg by mouth daily at 12 noon.     gabapentin (NEURONTIN) 100 MG capsule Take 100 mg by mouth at bedtime.     KRILL OIL PO Take 1 capsule by mouth daily.     levocetirizine (XYZAL) 5 MG tablet Take 5 mg  by mouth every evening.     losartan (COZAAR) 50 MG tablet Take 1 tablet (50 mg total) by mouth daily. 90 tablet 3   metoprolol tartrate (LOPRESSOR) 50 MG tablet Take 1 tablet by mouth 2 (two) times a day.     Multiple Vitamins-Minerals (MULTIVITAMINS THER. W/MINERALS) TABS tablet Take 1 tablet by mouth daily.     nitroGLYCERIN (NITROSTAT) 0.4 MG SL tablet PLACE 1 TABLET UNDER THE TONGUE EVERY 5 MINUTES AS NEEDED FOR CHEST PAIN 25 tablet 3   Red Yeast Rice Extract (RED YEAST RICE PO) Take 1 capsule by mouth daily.     spironolactone (ALDACTONE) 25 MG tablet Take 0.5 tablets (12.5 mg total) by mouth daily. 45 tablet 3   No current facility-administered medications for this visit.   Allergies:  Ciprofloxacin   ROS: No palpitations or syncope.  Physical Exam: VS:  BP 100/60   Pulse 60   Ht '5\' 6"'$  (1.676 m)   Wt 204 lb 12.8 oz (92.9 kg)   SpO2 97%   BMI 33.06 kg/m , BMI Body mass index is 33.06 kg/m.  Wt Readings from Last 3 Encounters:  05/25/22 204 lb 12.8 oz (92.9 kg)  02/26/22 203 lb 9.6 oz (92.4 kg)  01/04/22 197 lb 6.4 oz (89.5 kg)    General: Patient appears  comfortable at rest. HEENT: Conjunctiva and lids normal. Neck: Supple, no elevated JVP or carotid bruits. Lungs: Clear to auscultation, nonlabored breathing at rest. Cardiac: Regular rate and rhythm, no S3 or significant systolic murmur.  ECG:  An ECG dated 11/10/2021 was personally reviewed today and demonstrated:  Sinus rhythm with PVC.  Nonspecific T wave changes.  Recent Labwork: 09/29/2021: B Natriuretic Peptide 12.0 11/10/2021: Hemoglobin 13.1; Platelets 265 03/22/2022: BUN 19; Creatinine, Ser 0.95; Potassium 4.7; Sodium 141  June 2023: Cholesterol 164, triglycerides 198, HDL 43, LDL 87  Other Studies Reviewed Today:  Echocardiogram 11/26/2021:  1. Left ventricular ejection fraction, by estimation, is 60 to 65%. The  left ventricle has normal function. The left ventricle has no regional  wall motion  abnormalities. Left ventricular diastolic parameters are  consistent with Grade II diastolic  dysfunction (pseudonormalization). Elevated left ventricular end-diastolic  pressure.   2. Right ventricular systolic function is normal. The right ventricular  size is normal. Tricuspid regurgitation signal is inadequate for assessing  PA pressure.   3. Left atrial size was mildly dilated.   4. A small pericardial effusion is present. The pericardial effusion is  circumferential.   5. The mitral valve is grossly normal. Trivial mitral valve  regurgitation.   6. The aortic valve is tricuspid. There is mild calcification of the  aortic valve. Aortic valve regurgitation is trivial. Aortic valve  sclerosis is present, with no evidence of aortic valve stenosis.   7. The inferior vena cava is normal in size with greater than 50%  respiratory variability, suggesting right atrial pressure of 3 mmHg.    Lexiscan Myoview 01/11/2022:   The study is normal. The study is low risk.   The ECG was negative for ischemia.   LV perfusion is normal.   Left ventricular function is normal. Nuclear stress EF: 82 %.   Low risk study with no significant ischemia and LVEF 82%.   Cardiac monitor in August 2023: Predominant rhythm is sinus with prolonged PR interval, heart rate ranging from 48 bpm up to 129 bpm and average heart rate 69 bpm.  There were occasional PACs representing 1% total beats.  Rare PVCs representing less than 1% total beats.  Possible episode of limited atrial fibrillation noted on August 12 (although this could be an ectopic atrial tachycardia or artifact), also 9-second run of NSVT versus aberrant atrial tachycardia on the same day.  No pauses.  Assessment and Plan:  1.  Intermittent dyspnea on exertion in the setting of hypertension, paroxysmal ectopic atrial tachycardia, and moderate diastolic dysfunction.  Managing medically with reassuring ischemic work-up described above.  Recommended a  walking plan for exercise.  Continue Cozaar, Lopressor, and Aldactone.  2.  Mild coronary atherosclerosis by prior work-up with negative noninvasive ischemic testing in July of this year.  Continue aspirin and red yeast rice (she prefers to hold off on statin therapy).  3.  Essential hypertension, blood pressure is low normal today.  No changes made to current regimen.  Medication Adjustments/Labs and Tests Ordered: Current medicines are reviewed at length with the patient today.  Concerns regarding medicines are outlined above.   Tests Ordered: No orders of the defined types were placed in this encounter.   Medication Changes: Meds ordered this encounter  Medications   spironolactone (ALDACTONE) 25 MG tablet    Sig: Take 0.5 tablets (12.5 mg total) by mouth daily.    Dispense:  45 tablet    Refill:  3    02/26/2022 NEW  Disposition:  Follow up  6 months.  Signed, [name], MD, [facility] 05/25/2022 1:41 PM    [facility] [place], [place], [place] [address] Phone: [phone]; Fax: [phone]

## 2022-05-27 DIAGNOSIS — Z23 Encounter for immunization: Secondary | ICD-10-CM | POA: Diagnosis not present

## 2022-05-27 DIAGNOSIS — R7303 Prediabetes: Secondary | ICD-10-CM | POA: Diagnosis not present

## 2022-05-27 DIAGNOSIS — E785 Hyperlipidemia, unspecified: Secondary | ICD-10-CM | POA: Diagnosis not present

## 2022-05-27 DIAGNOSIS — E669 Obesity, unspecified: Secondary | ICD-10-CM | POA: Diagnosis not present

## 2022-06-03 DIAGNOSIS — E785 Hyperlipidemia, unspecified: Secondary | ICD-10-CM | POA: Diagnosis not present

## 2022-06-03 DIAGNOSIS — J439 Emphysema, unspecified: Secondary | ICD-10-CM | POA: Diagnosis not present

## 2022-06-03 DIAGNOSIS — R7303 Prediabetes: Secondary | ICD-10-CM | POA: Diagnosis not present

## 2022-06-03 DIAGNOSIS — Z0000 Encounter for general adult medical examination without abnormal findings: Secondary | ICD-10-CM | POA: Diagnosis not present

## 2022-06-03 DIAGNOSIS — E669 Obesity, unspecified: Secondary | ICD-10-CM | POA: Diagnosis not present

## 2022-06-03 DIAGNOSIS — G629 Polyneuropathy, unspecified: Secondary | ICD-10-CM | POA: Diagnosis not present

## 2022-06-03 DIAGNOSIS — R0609 Other forms of dyspnea: Secondary | ICD-10-CM | POA: Diagnosis not present

## 2022-06-03 DIAGNOSIS — Z23 Encounter for immunization: Secondary | ICD-10-CM | POA: Diagnosis not present

## 2022-06-03 DIAGNOSIS — N182 Chronic kidney disease, stage 2 (mild): Secondary | ICD-10-CM | POA: Diagnosis not present

## 2022-06-03 DIAGNOSIS — R000 Tachycardia, unspecified: Secondary | ICD-10-CM | POA: Diagnosis not present

## 2022-06-03 DIAGNOSIS — I10 Essential (primary) hypertension: Secondary | ICD-10-CM | POA: Diagnosis not present

## 2022-06-03 DIAGNOSIS — I2510 Atherosclerotic heart disease of native coronary artery without angina pectoris: Secondary | ICD-10-CM | POA: Diagnosis not present

## 2022-08-24 ENCOUNTER — Other Ambulatory Visit: Payer: Self-pay | Admitting: Cardiology

## 2022-10-11 DIAGNOSIS — R7303 Prediabetes: Secondary | ICD-10-CM | POA: Diagnosis not present

## 2022-10-11 DIAGNOSIS — E785 Hyperlipidemia, unspecified: Secondary | ICD-10-CM | POA: Diagnosis not present

## 2022-11-08 ENCOUNTER — Telehealth: Payer: Self-pay | Admitting: Cardiology

## 2022-11-08 MED ORDER — LOSARTAN POTASSIUM 50 MG PO TABS: 50.0000 mg | ORAL_TABLET | Freq: Every day | ORAL | 3 refills | Status: DC

## 2022-11-08 NOTE — Telephone Encounter (Signed)
*  STAT* If patient is at the pharmacy, call can be transferred to refill team.   1. Which medications need to be refilled? (please list name of each medication and dose if known)   losartan (COZAAR) 50 MG tablet   2. Which pharmacy/location (including street and city if local pharmacy) is medication to be sent to?  [facility] - [place], [place] - [address]   3. Do they need a 30 day or 90 day supply?   90 day  Patient stated she has some of this medication left.

## 2022-11-19 IMAGING — DX DG CHEST 2V
2 series · 2 of 2 positions shown · non-contrast
Comparison: 09/19/2017

CLINICAL DATA: Dyspnea, shortness of breath with episodes becoming
more for frequent

EXAM:
CHEST - 2 VIEW

[chest pa]
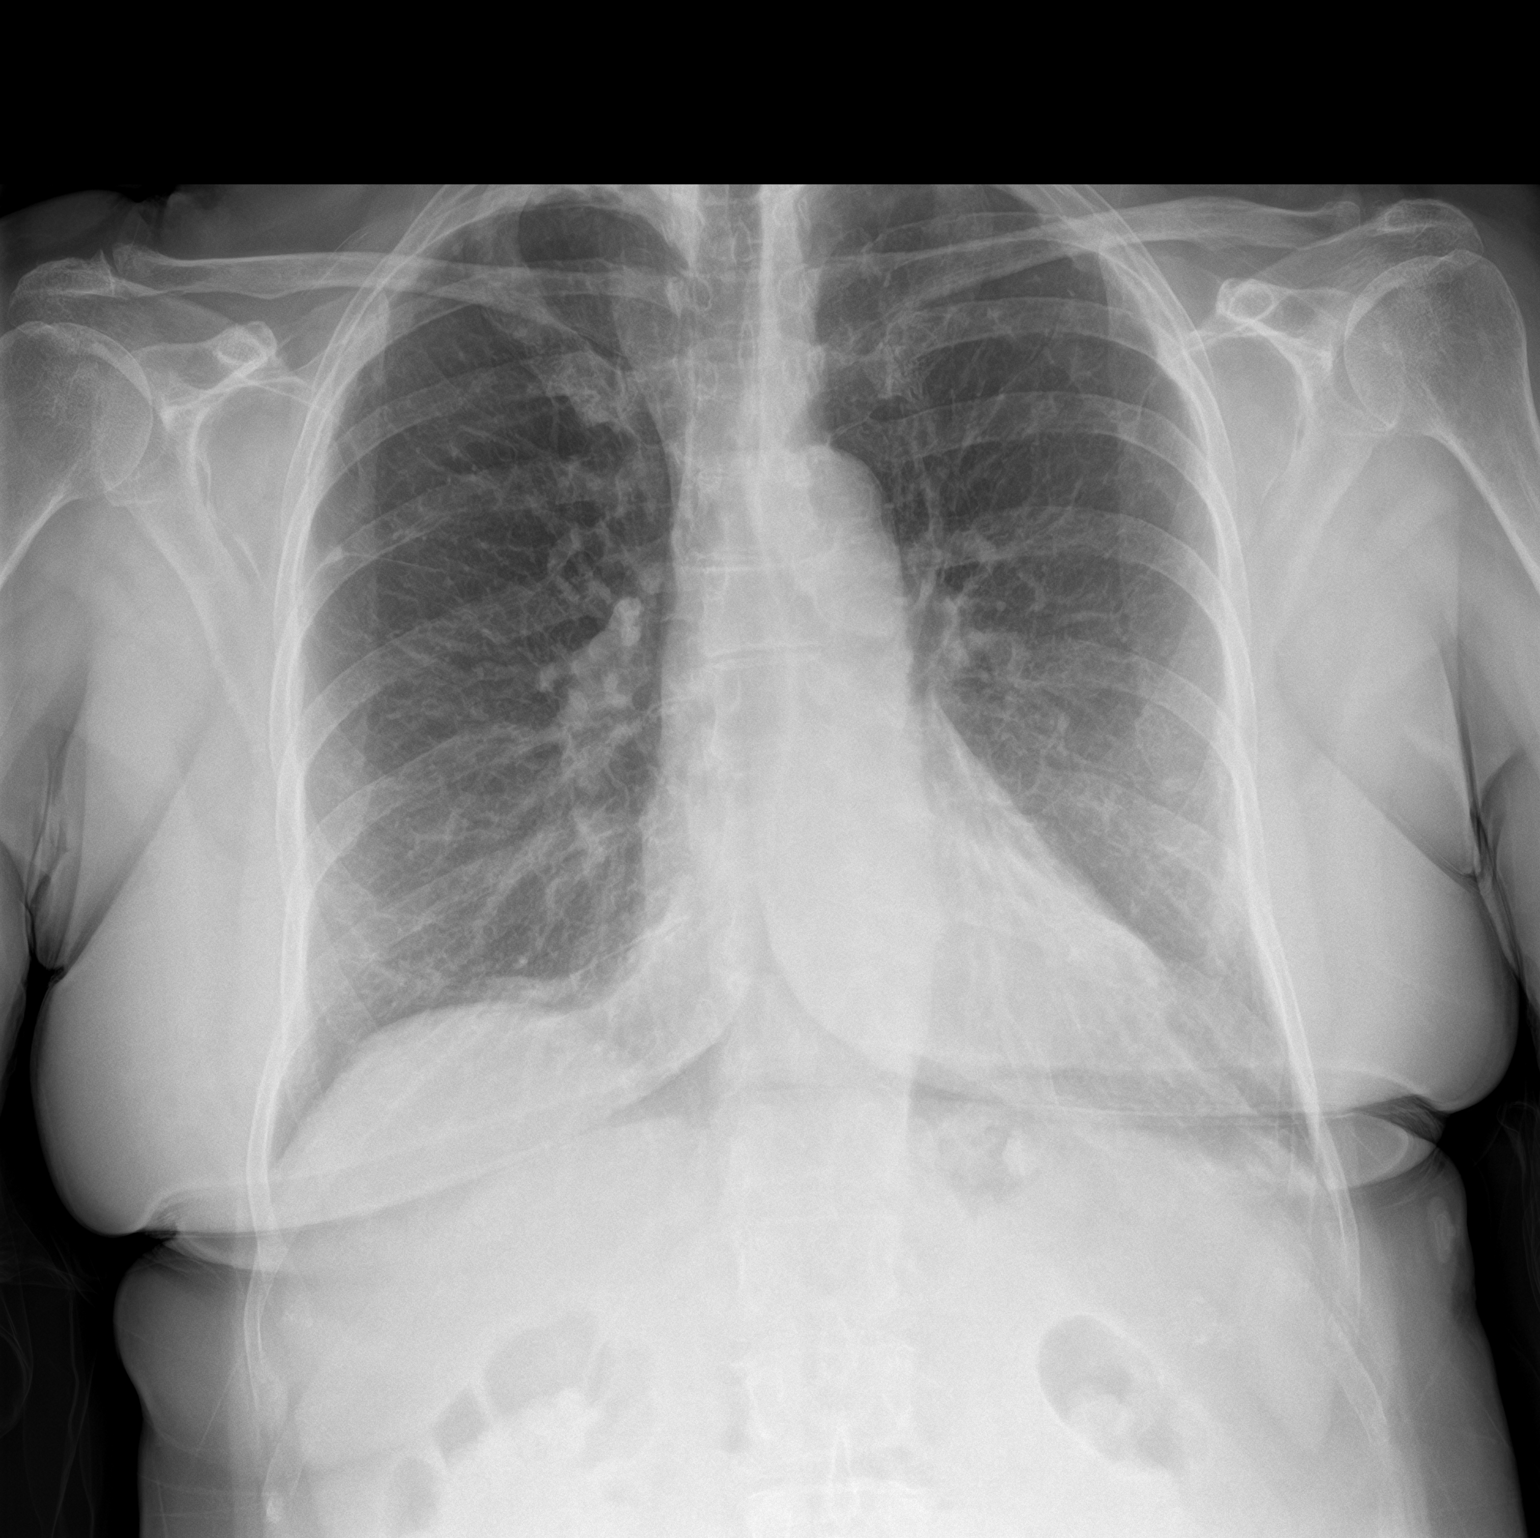

[chest lat]
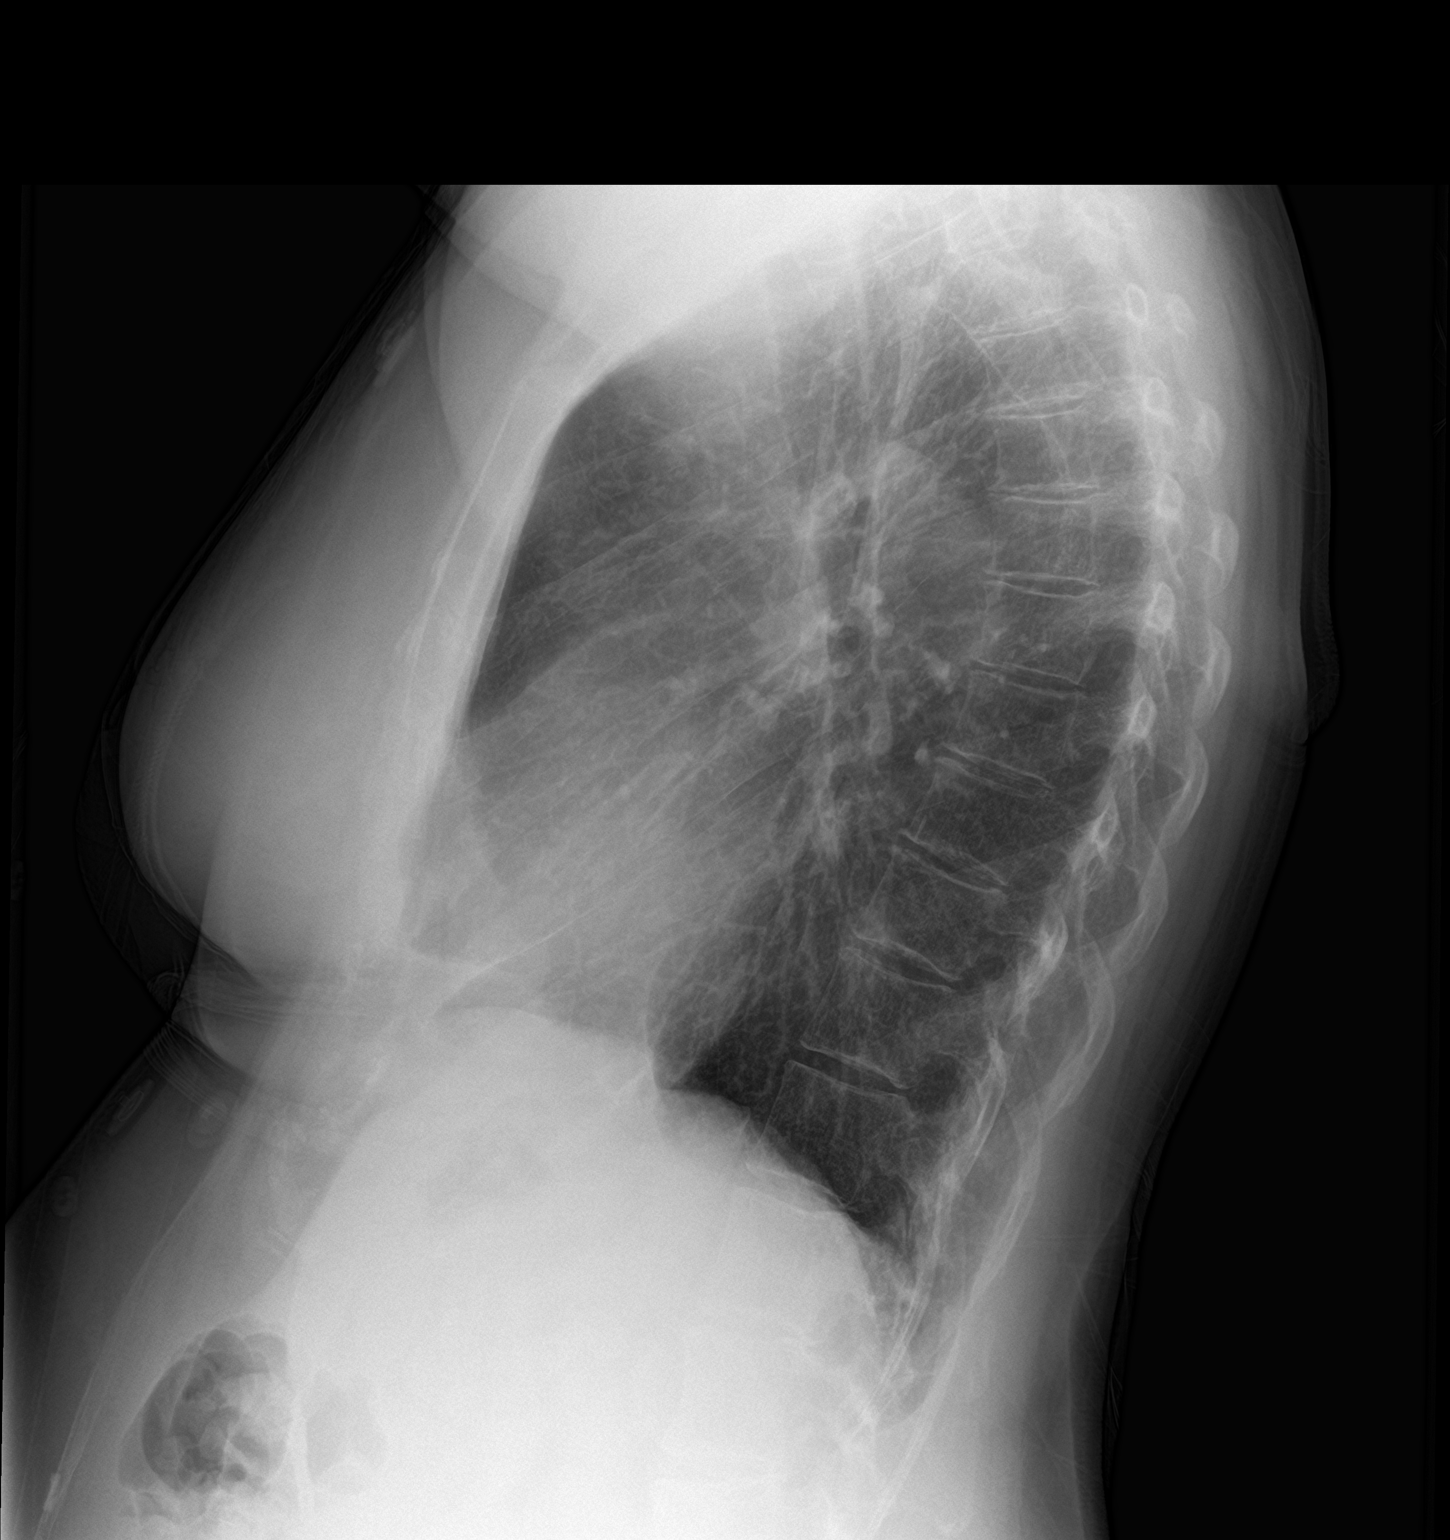

[2 of 2 positions shown; findings below may reference images not displayed]

FINDINGS: Normal heart size, mediastinal contours, and pulmonary vascularity.

Lungs appear emphysematous with biapical scarring.

Minimal subsegmental atelectasis RIGHT base.

No acute infiltrate, pleural effusion, or pneumothorax.

Bones demineralized.
IMPRESSION: Emphysematous changes with biapical scarring.

No acute abnormalities.

Emphysema ([id number]).

## 2022-12-15 DIAGNOSIS — I10 Essential (primary) hypertension: Secondary | ICD-10-CM | POA: Diagnosis not present

## 2022-12-21 DIAGNOSIS — J309 Allergic rhinitis, unspecified: Secondary | ICD-10-CM | POA: Diagnosis not present

## 2022-12-21 DIAGNOSIS — J439 Emphysema, unspecified: Secondary | ICD-10-CM | POA: Diagnosis not present

## 2022-12-21 DIAGNOSIS — I2510 Atherosclerotic heart disease of native coronary artery without angina pectoris: Secondary | ICD-10-CM | POA: Diagnosis not present

## 2022-12-21 DIAGNOSIS — Z6835 Body mass index (BMI) 35.0-35.9, adult: Secondary | ICD-10-CM | POA: Diagnosis not present

## 2022-12-21 DIAGNOSIS — I10 Essential (primary) hypertension: Secondary | ICD-10-CM | POA: Diagnosis not present

## 2022-12-21 DIAGNOSIS — E669 Obesity, unspecified: Secondary | ICD-10-CM | POA: Diagnosis not present

## 2022-12-21 DIAGNOSIS — R0609 Other forms of dyspnea: Secondary | ICD-10-CM | POA: Diagnosis not present

## 2022-12-21 DIAGNOSIS — Z0001 Encounter for general adult medical examination with abnormal findings: Secondary | ICD-10-CM | POA: Diagnosis not present

## 2022-12-21 DIAGNOSIS — R7303 Prediabetes: Secondary | ICD-10-CM | POA: Diagnosis not present

## 2022-12-21 DIAGNOSIS — R000 Tachycardia, unspecified: Secondary | ICD-10-CM | POA: Diagnosis not present

## 2022-12-21 DIAGNOSIS — G629 Polyneuropathy, unspecified: Secondary | ICD-10-CM | POA: Diagnosis not present

## 2022-12-21 DIAGNOSIS — E785 Hyperlipidemia, unspecified: Secondary | ICD-10-CM | POA: Diagnosis not present

## 2023-01-01 IMAGING — CT CT ANGIO CHEST
2 of 8 series · 16 of 36 positions shown · IV contrast (Omnipaque or Isovue)
Comparison: Chest radiograph dated 11/10/2021

CLINICAL DATA: Shortness of breath, back pain, evaluate for PE

EXAM:
CT ANGIOGRAPHY CHEST WITH CONTRAST
TECHNIQUE: Multidetector CT imaging of the chest was performed using the
standard protocol during bolus administration of intravenous
contrast. Multiplanar CT image reconstructions and MIPs were
obtained to evaluate the vascular anatomy.

[Series 5: pe axial thins · axial · 0.59mm/px · z∈[+1361,+1592]mm · 15 of 331 slices shown]
[im 21/331  lung]
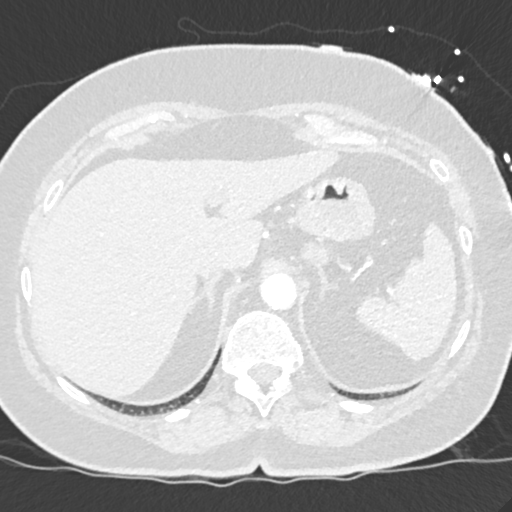
[im 42/331  mediastinal]
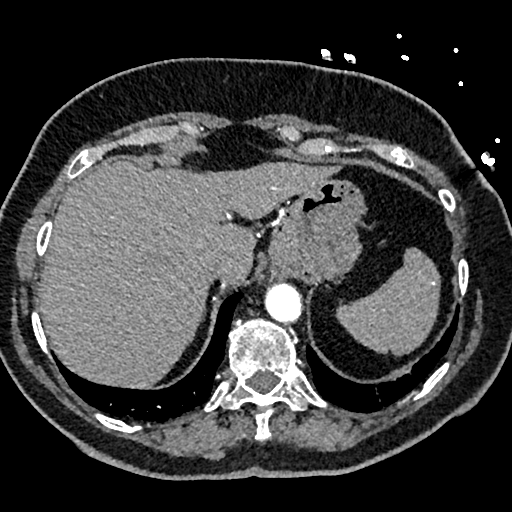
[im 62/331  lung]
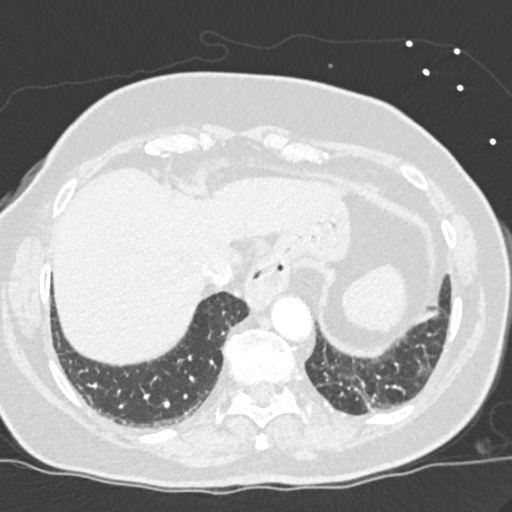
[im 83/331  mediastinal]
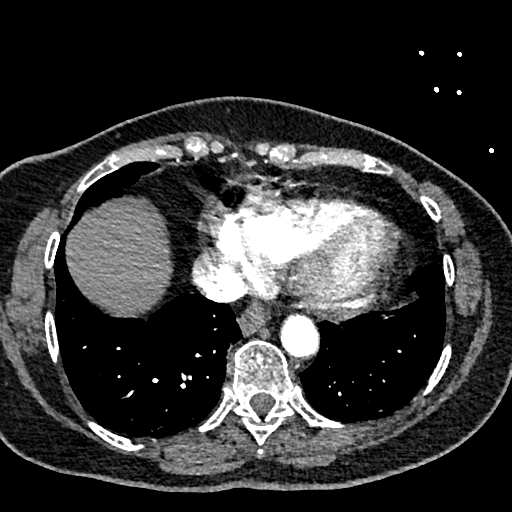
[im 104/331  lung]
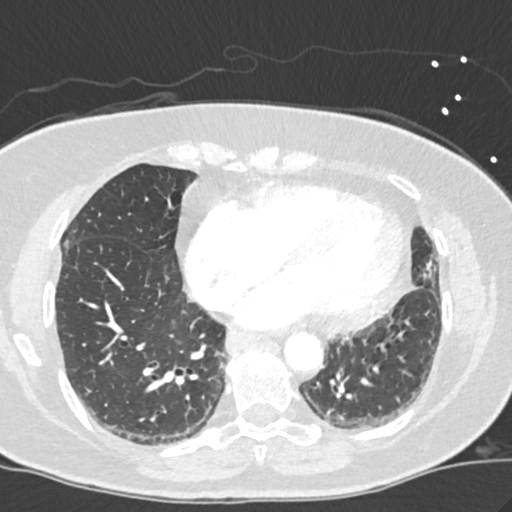
[im 124/331  mediastinal]
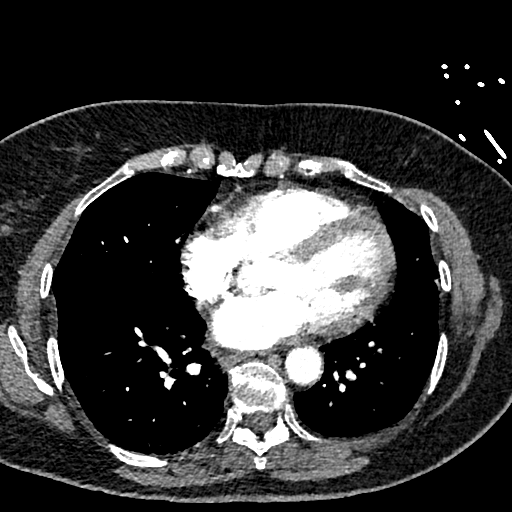
[im 145/331  lung]
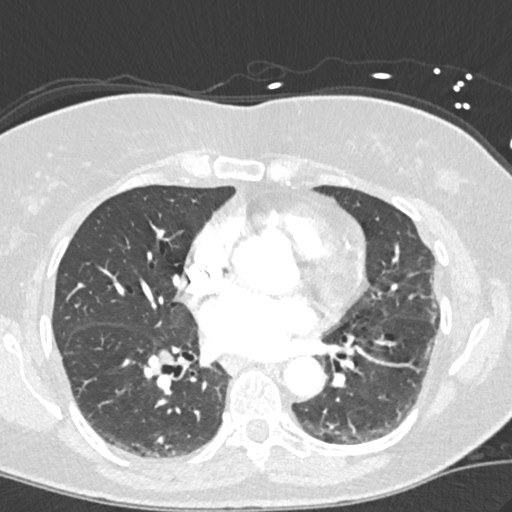
[im 166/331  mediastinal]
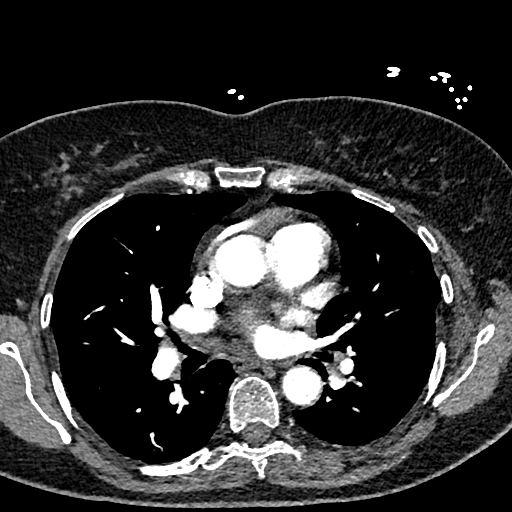
[im 186/331  lung]
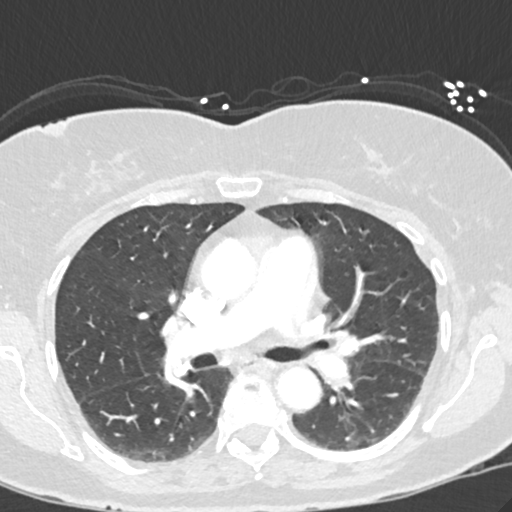
[im 207/331  mediastinal]
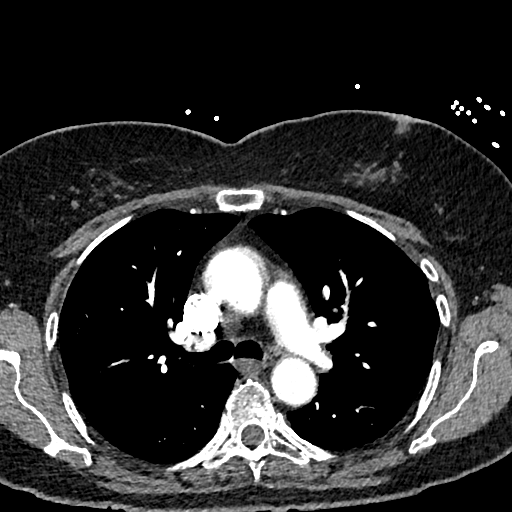
[im 227/331  lung]
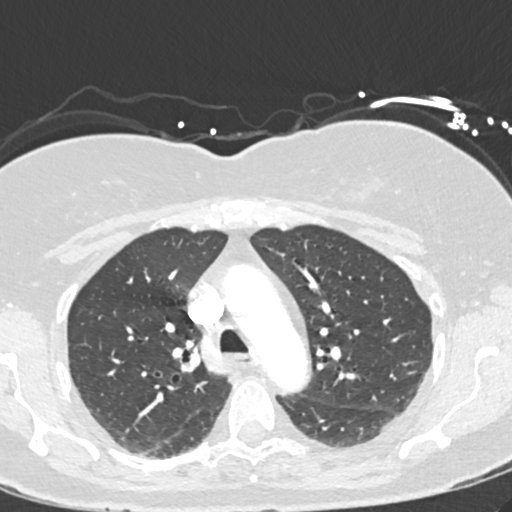
[im 248/331  mediastinal]
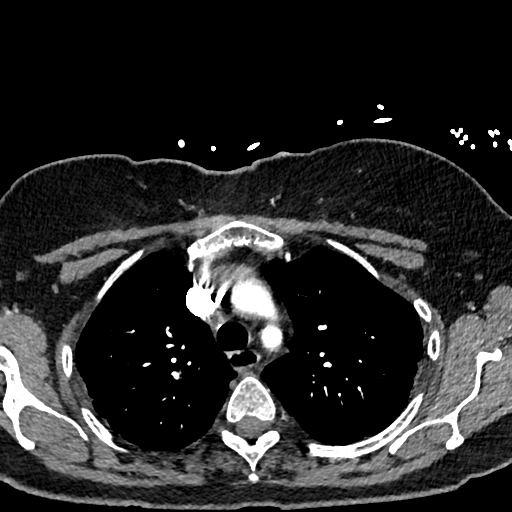
[im 269/331  lung]
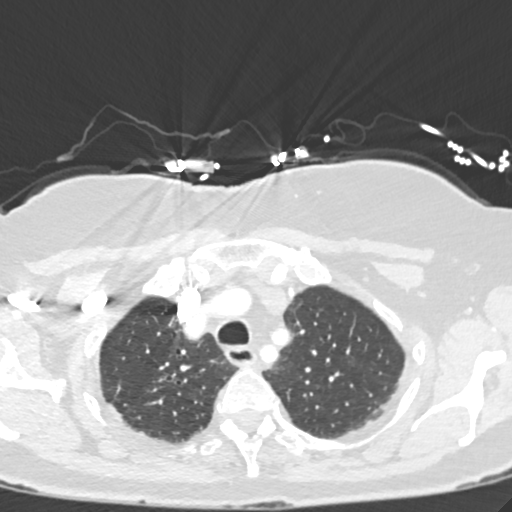
[im 289/331  mediastinal]
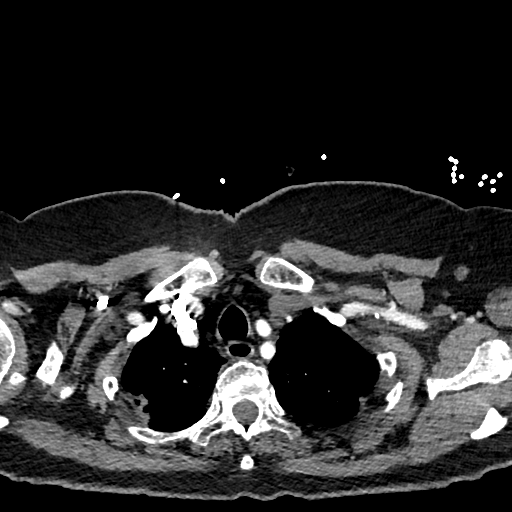
[im 310/331  lung]
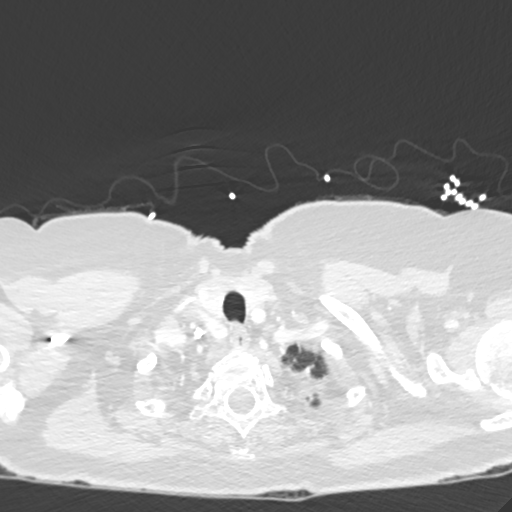

[Series 8: cor soft · coronal · 0.53mm/px · 1 of 103 slices shown]
[im 52/103  mediastinal]
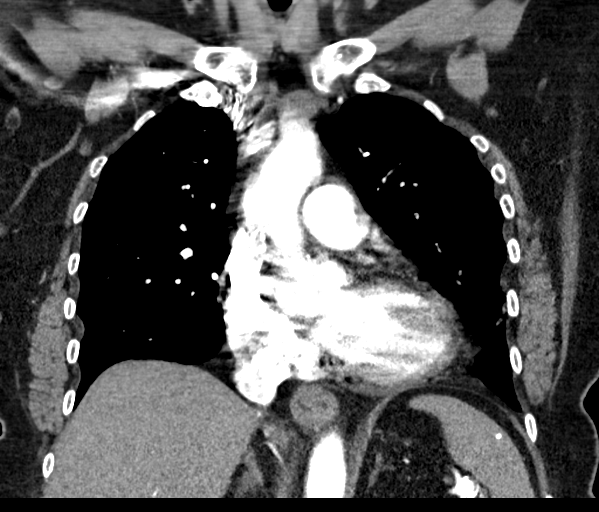

[16 of 36 positions shown; findings below may reference images not displayed]

RADIATION DOSE REDUCTION: This exam was performed according to the
departmental dose-optimization program which includes automated
exposure control, adjustment of the mA and/or kV according to
patient size and/or use of iterative reconstruction technique.

CONTRAST:  75mL OMNIPAQUE IOHEXOL 350 MG/ML SOLN
FINDINGS: Cardiovascular: Satisfactory opacification of the bilateral
pulmonary arteries to the lobar level. No evidence of pulmonary
embolism.

Study is not tailored for evaluation of the thoracic aorta. No
evidence of thoracic aortic aneurysm or dissection. Mild
atherosclerotic calcifications.

Heart is normal in size.  No pericardial effusion.

Mediastinum/Nodes: No suspicious mediastinal lymphadenopathy.

Visualized thyroid is unremarkable.

Lungs/Pleura: Biapical pleural-parenchymal scarring.

Mild subpleural reticulation/fibrosis in the lingula and bilateral
lower lobes, possibly post infectious inflammatory.

No focal consolidation.

Evaluation lung parenchyma is constrained by respiratory motion.
Within that constraint, there are no suspicious pulmonary nodules.

No pleural effusion or pneumothorax.

Upper Abdomen: Visualized upper abdomen is notable for a 13 mm
splenic artery aneurysm (series 4/image 87).

Musculoskeletal: Visualized osseous structures are within normal
limits.

Review of the MIP images confirms the above findings.
IMPRESSION: No evidence of pulmonary embolism.

No evidence of acute cardiopulmonary disease.

Aortic Atherosclerosis ([id number]).

## 2023-01-03 ENCOUNTER — Other Ambulatory Visit (HOSPITAL_COMMUNITY): Payer: Self-pay | Admitting: Radiology

## 2023-01-03 DIAGNOSIS — R0609 Other forms of dyspnea: Secondary | ICD-10-CM

## 2023-01-11 ENCOUNTER — Encounter: Payer: Self-pay | Admitting: Cardiology

## 2023-01-11 ENCOUNTER — Ambulatory Visit: Payer: Medicare Other | Attending: Cardiology | Admitting: Cardiology

## 2023-01-11 VITALS — BP 124/68 | HR 64 | Ht 66.0 in | Wt 211.2 lb

## 2023-01-11 DIAGNOSIS — I10 Essential (primary) hypertension: Secondary | ICD-10-CM | POA: Diagnosis not present

## 2023-01-11 DIAGNOSIS — I2510 Atherosclerotic heart disease of native coronary artery without angina pectoris: Secondary | ICD-10-CM | POA: Insufficient documentation

## 2023-01-11 NOTE — Progress Notes (Signed)
Cardiology Office Note  Date: 01/11/2023   ID: Nancy Winters 1946-01-21, MRN [id number]  History of Present Illness: Nancy Winters is a 77 y.o. female last seen in December 2023.  She is here for a routine visit.  Reports no angina, fairly stable dyspnea on exertion.  She is following regularly with PCP and has PFTs pending.  Has had some intermittent ankle and lower leg edema, although improved at this time.  I reviewed her recent lab work.  LDL 88 in June, renal function and potassium normal.  She remains on Aldactone and losartan along with Lopressor with good blood pressure control today.  Also on aspirin and Zetia.  Physical Exam: VS:  BP 124/68   Pulse 64   Ht 5\' 6"  (1.676 m)   Wt 211 lb 3.2 oz (95.8 kg)   SpO2 98%   BMI 34.09 kg/m , BMI Body mass index is 34.09 kg/m.  Wt Readings from Last 3 Encounters:  01/11/23 211 lb 3.2 oz (95.8 kg)  05/25/22 204 lb 12.8 oz (92.9 kg)  02/26/22 203 lb 9.6 oz (92.4 kg)    General: Patient appears comfortable at rest. HEENT: Conjunctiva and lids normal. Neck: Supple, no elevated JVP or carotid bruits. Lungs: Clear to auscultation, nonlabored breathing at rest. Cardiac: Regular rate and rhythm, no S3 or significant systolic murmur. Extremities: No pitting edema.  ECG:  An ECG dated 11/10/2021 was personally reviewed today and demonstrated:  Sinus rhythm with PVC, nonspecific T wave changes.  Labwork: 03/22/2022: BUN 19; Creatinine, Ser 0.95; Potassium 4.7; Sodium 141  June 2024: Hemoglobin 13.7, platelets 247, BUN 22, creatinine 0.92, potassium 4.7, AST 25, ALT 27, cholesterol 163, triglycerides 169, HDL 46, LDL 88  Other Studies Reviewed Today:  Echocardiogram 11/26/2021:  1. Left ventricular ejection fraction, by estimation, is 60 to 65%. The  left ventricle has normal function. The left ventricle has no regional  wall motion abnormalities. Left ventricular diastolic parameters are  consistent with Grade II diastolic   dysfunction (pseudonormalization). Elevated left ventricular end-diastolic  pressure.   2. Right ventricular systolic function is normal. The right ventricular  size is normal. Tricuspid regurgitation signal is inadequate for assessing  PA pressure.   3. Left atrial size was mildly dilated.   4. A small pericardial effusion is present. The pericardial effusion is  circumferential.   5. The mitral valve is grossly normal. Trivial mitral valve  regurgitation.   6. The aortic valve is tricuspid. There is mild calcification of the  aortic valve. Aortic valve regurgitation is trivial. Aortic valve  sclerosis is present, with no evidence of aortic valve stenosis.   7. The inferior vena cava is normal in size with greater than 50%  respiratory variability, suggesting right atrial pressure of 3 mmHg.  Lexiscan Myoview 01/11/2022:   The study is normal. The study is low risk.   The ECG was negative for ischemia.   LV perfusion is normal.   Left ventricular function is normal. Nuclear stress EF: 82 %.   Low risk study with no significant ischemia and LVEF 82%.  Assessment and Plan:  1.  Mild coronary atherosclerosis by cardiac catheterization in 2015.  More recent ischemic testing via Lexiscan Myoview in July 2023 was low risk as outlined above.  Plan to continue risk factor reduction.  She does not report any definite angina at this time.  Cardiac testing from last year reviewed above.  Continue aspirin and Zetia.  2.  Essential hypertension.  Blood pressure is well-controlled today.  Continue Cozaar, Aldactone, and Lopressor.  Disposition:  Follow up  6 months.  Signed, Jonelle Sidle, M.D., F.A.C.C. [facility] at [facility]

## 2023-01-11 NOTE — Patient Instructions (Addendum)

## 2023-01-25 ENCOUNTER — Ambulatory Visit (HOSPITAL_COMMUNITY)
Admission: RE | Admit: 2023-01-25 | Discharge: 2023-01-25 | Disposition: A | Discharge status: Home / Self Care | Payer: Medicare Other | Source: Ambulatory Visit | Attending: Internal Medicine | Admitting: Internal Medicine

## 2023-01-25 DIAGNOSIS — R0609 Other forms of dyspnea: Secondary | ICD-10-CM | POA: Insufficient documentation

## 2023-01-25 MED ORDER — ALBUTEROL SULFATE (2.5 MG/3ML) 0.083% IN NEBU
2.5000 mg | INHALATION_SOLUTION | Freq: Once | RESPIRATORY_TRACT | Status: AC
  Administered 2023-01-25: 2.5 mg via RESPIRATORY_TRACT

## 2023-02-10 DIAGNOSIS — N898 Other specified noninflammatory disorders of vagina: Secondary | ICD-10-CM | POA: Diagnosis not present

## 2023-02-10 DIAGNOSIS — N904 Leukoplakia of vulva: Secondary | ICD-10-CM | POA: Diagnosis not present

## 2023-02-10 DIAGNOSIS — Z1231 Encounter for screening mammogram for malignant neoplasm of breast: Secondary | ICD-10-CM | POA: Diagnosis not present

## 2023-02-10 DIAGNOSIS — M8580 Other specified disorders of bone density and structure, unspecified site: Secondary | ICD-10-CM | POA: Diagnosis not present

## 2023-02-10 DIAGNOSIS — Z1212 Encounter for screening for malignant neoplasm of rectum: Secondary | ICD-10-CM | POA: Diagnosis not present

## 2023-02-10 DIAGNOSIS — Z780 Asymptomatic menopausal state: Secondary | ICD-10-CM | POA: Diagnosis not present

## 2023-02-10 DIAGNOSIS — L03317 Cellulitis of buttock: Secondary | ICD-10-CM | POA: Diagnosis not present

## 2023-02-10 DIAGNOSIS — Z01419 Encounter for gynecological examination (general) (routine) without abnormal findings: Secondary | ICD-10-CM | POA: Diagnosis not present

## 2023-02-21 DIAGNOSIS — Z1212 Encounter for screening for malignant neoplasm of rectum: Secondary | ICD-10-CM | POA: Diagnosis not present

## 2023-02-28 LAB — COLOGUARD: COLOGUARD: NEGATIVE

## 2023-02-28 LAB — EXTERNAL GENERIC LAB PROCEDURE: COLOGUARD: NEGATIVE

## 2023-03-22 ENCOUNTER — Other Ambulatory Visit: Payer: Self-pay | Admitting: Cardiology

## 2023-03-22 DIAGNOSIS — Z1231 Encounter for screening mammogram for malignant neoplasm of breast: Secondary | ICD-10-CM | POA: Diagnosis not present

## 2023-03-22 MED ORDER — LOSARTAN POTASSIUM 50 MG PO TABS: 50.0000 mg | ORAL_TABLET | Freq: Every day | ORAL | 3 refills | Status: DC

## 2023-03-30 DIAGNOSIS — R7303 Prediabetes: Secondary | ICD-10-CM | POA: Diagnosis not present

## 2023-03-30 DIAGNOSIS — E785 Hyperlipidemia, unspecified: Secondary | ICD-10-CM | POA: Diagnosis not present

## 2023-05-13 DIAGNOSIS — I129 Hypertensive chronic kidney disease with stage 1 through stage 4 chronic kidney disease, or unspecified chronic kidney disease: Secondary | ICD-10-CM | POA: Diagnosis not present

## 2023-05-13 DIAGNOSIS — I10 Essential (primary) hypertension: Secondary | ICD-10-CM | POA: Diagnosis not present

## 2023-05-13 DIAGNOSIS — I2510 Atherosclerotic heart disease of native coronary artery without angina pectoris: Secondary | ICD-10-CM | POA: Diagnosis not present

## 2023-05-13 DIAGNOSIS — R7303 Prediabetes: Secondary | ICD-10-CM | POA: Diagnosis not present

## 2023-05-13 DIAGNOSIS — E785 Hyperlipidemia, unspecified: Secondary | ICD-10-CM | POA: Diagnosis not present

## 2023-05-13 DIAGNOSIS — E669 Obesity, unspecified: Secondary | ICD-10-CM | POA: Diagnosis not present

## 2023-05-13 DIAGNOSIS — N182 Chronic kidney disease, stage 2 (mild): Secondary | ICD-10-CM | POA: Diagnosis not present

## 2023-05-13 DIAGNOSIS — R000 Tachycardia, unspecified: Secondary | ICD-10-CM | POA: Diagnosis not present

## 2023-05-13 DIAGNOSIS — G629 Polyneuropathy, unspecified: Secondary | ICD-10-CM | POA: Diagnosis not present

## 2023-05-13 DIAGNOSIS — R0602 Shortness of breath: Secondary | ICD-10-CM | POA: Diagnosis not present

## 2023-05-13 DIAGNOSIS — Z23 Encounter for immunization: Secondary | ICD-10-CM | POA: Diagnosis not present

## 2023-05-13 DIAGNOSIS — J439 Emphysema, unspecified: Secondary | ICD-10-CM | POA: Diagnosis not present

## 2023-05-18 DIAGNOSIS — H43813 Vitreous degeneration, bilateral: Secondary | ICD-10-CM | POA: Diagnosis not present

## 2023-05-18 DIAGNOSIS — H35033 Hypertensive retinopathy, bilateral: Secondary | ICD-10-CM | POA: Diagnosis not present

## 2023-05-18 DIAGNOSIS — H524 Presbyopia: Secondary | ICD-10-CM | POA: Diagnosis not present

## 2023-05-18 DIAGNOSIS — H26493 Other secondary cataract, bilateral: Secondary | ICD-10-CM | POA: Diagnosis not present

## 2023-05-26 ENCOUNTER — Telehealth: Payer: Self-pay | Admitting: Cardiology

## 2023-05-26 MED ORDER — LOSARTAN POTASSIUM 50 MG PO TABS: 50.0000 mg | ORAL_TABLET | Freq: Every day | ORAL | 3 refills | Status: DC

## 2023-05-26 MED ORDER — SPIRONOLACTONE 25 MG PO TABS: 12.5000 mg | ORAL_TABLET | Freq: Every day | ORAL | 3 refills | Status: DC

## 2023-05-26 NOTE — Telephone Encounter (Signed)
Refilled as requested  

## 2023-05-26 NOTE — Telephone Encounter (Signed)
*  STAT* If patient is at the pharmacy, call can be transferred to refill team.   1. Which medications need to be refilled? (please list name of each medication and dose if known) losartan (COZAAR) 50 MG tablet ; spironolactone (ALDACTONE) 25 MG tablet    2. Would you like to learn more about the convenience, safety, & potential cost savings by using the [facility]?     3. Are you open to using the [facility] (Type [facility].  ).   4. Which pharmacy/location (including street and city if local pharmacy) is medication to be sent to? [facility] - [place], [place] - [address]    5. Do they need a 30 day or 90 day supply? 90

## 2023-07-18 ENCOUNTER — Ambulatory Visit: Payer: Medicare Other | Attending: Cardiology | Admitting: Cardiology

## 2023-07-18 ENCOUNTER — Encounter: Payer: Self-pay | Admitting: Cardiology

## 2023-07-18 VITALS — BP 116/64 | HR 58 | Ht 66.0 in | Wt 209.4 lb

## 2023-07-18 DIAGNOSIS — I2510 Atherosclerotic heart disease of native coronary artery without angina pectoris: Secondary | ICD-10-CM | POA: Insufficient documentation

## 2023-07-18 DIAGNOSIS — I10 Essential (primary) hypertension: Secondary | ICD-10-CM | POA: Diagnosis not present

## 2023-07-18 NOTE — Progress Notes (Signed)
    Cardiology Office Note  Date: 07/18/2023   ID: Syrai, Gladwin 1946-01-03, MRN [id number]  History of Present Illness: Nancy Winters is a 78 y.o. female last seen in July 2024.  She is here for a routine visit.  Reports no angina nitroglycerin use in the interim, no increasing dyspnea with typical activities, no palpitations or syncope.  I reviewed her medications.  Current regimen includes aspirin, Zetia, Cozaar, Lopressor, Aldactone, and as needed nitroglycerin.  She reports compliance with her medications.  Lab work from October 2024 is reviewed below.  Physical Exam: VS:  BP 116/64   Pulse (!) 58   Ht 5\' 6"  (1.676 m)   Wt 209 lb 6.4 oz (95 kg)   SpO2 97%   BMI 33.80 kg/m , BMI Body mass index is 33.8 kg/m.  Wt Readings from Last 3 Encounters:  07/18/23 209 lb 6.4 oz (95 kg)  01/11/23 211 lb 3.2 oz (95.8 kg)  05/25/22 204 lb 12.8 oz (92.9 kg)    General: Patient appears comfortable at rest. HEENT: Conjunctiva and lids normal. Neck: Supple, no elevated JVP or carotid bruits. Lungs: Clear to auscultation, nonlabored breathing at rest. Cardiac: Regular rate and rhythm, no S3 or significant systolic murmur Extremities: No pitting edema.  ECG:  An ECG dated 01/11/2023 was personally reviewed today and demonstrated:  Sinus bradycardia.  Labwork:  October 2024: Hemoglobin 12.8, platelets 252, BUN 14, creatinine 0.99, potassium 4.3, AST 25, ALT 21, cholesterol 153, triglycerides 197, HDL 41, LDL 79, hemoglobin A1c 5.7%  Other Studies Reviewed Today:  No interval cardiac testing for review today.  Assessment and Plan:  1.  Mild coronary atherosclerosis by cardiac catheterization in 2015.  More recent ischemic testing via Lexiscan Myoview in July 2023 was low risk.  She does not report any angina or nitroglycerin use.  Continue observation on aspirin, Lopressor, and Zetia.   2.  Primary hypertension.  Blood pressure well-controlled today on Cozaar, Lopressor, and  Aldactone.  Keep follow-up with Dr. Margo Aye.  Disposition:  Follow up  1 year.  Signed, Jonelle Sidle, M.D., F.A.C.C. [facility] at [facility]

## 2023-07-18 NOTE — Patient Instructions (Addendum)

## 2023-07-28 DIAGNOSIS — R8290 Unspecified abnormal findings in urine: Secondary | ICD-10-CM | POA: Diagnosis not present

## 2023-07-28 DIAGNOSIS — R300 Dysuria: Secondary | ICD-10-CM | POA: Diagnosis not present

## 2023-12-12 DIAGNOSIS — R82998 Other abnormal findings in urine: Secondary | ICD-10-CM | POA: Diagnosis not present

## 2023-12-12 DIAGNOSIS — R399 Unspecified symptoms and signs involving the genitourinary system: Secondary | ICD-10-CM | POA: Diagnosis not present

## 2024-01-16 DIAGNOSIS — R7303 Prediabetes: Secondary | ICD-10-CM | POA: Diagnosis not present

## 2024-01-16 DIAGNOSIS — E785 Hyperlipidemia, unspecified: Secondary | ICD-10-CM | POA: Diagnosis not present

## 2024-01-27 DIAGNOSIS — I10 Essential (primary) hypertension: Secondary | ICD-10-CM | POA: Diagnosis not present

## 2024-01-27 DIAGNOSIS — E785 Hyperlipidemia, unspecified: Secondary | ICD-10-CM | POA: Diagnosis not present

## 2024-01-27 DIAGNOSIS — I2510 Atherosclerotic heart disease of native coronary artery without angina pectoris: Secondary | ICD-10-CM | POA: Diagnosis not present

## 2024-01-27 DIAGNOSIS — E669 Obesity, unspecified: Secondary | ICD-10-CM | POA: Diagnosis not present

## 2024-01-27 DIAGNOSIS — J439 Emphysema, unspecified: Secondary | ICD-10-CM | POA: Diagnosis not present

## 2024-01-27 DIAGNOSIS — J309 Allergic rhinitis, unspecified: Secondary | ICD-10-CM | POA: Diagnosis not present

## 2024-01-27 DIAGNOSIS — R000 Tachycardia, unspecified: Secondary | ICD-10-CM | POA: Diagnosis not present

## 2024-01-27 DIAGNOSIS — G629 Polyneuropathy, unspecified: Secondary | ICD-10-CM | POA: Diagnosis not present

## 2024-01-27 DIAGNOSIS — R7303 Prediabetes: Secondary | ICD-10-CM | POA: Diagnosis not present

## 2024-01-27 DIAGNOSIS — R0609 Other forms of dyspnea: Secondary | ICD-10-CM | POA: Diagnosis not present

## 2024-01-27 DIAGNOSIS — N182 Chronic kidney disease, stage 2 (mild): Secondary | ICD-10-CM | POA: Diagnosis not present

## 2024-01-27 DIAGNOSIS — I129 Hypertensive chronic kidney disease with stage 1 through stage 4 chronic kidney disease, or unspecified chronic kidney disease: Secondary | ICD-10-CM | POA: Diagnosis not present

## 2024-02-14 DIAGNOSIS — Z1231 Encounter for screening mammogram for malignant neoplasm of breast: Secondary | ICD-10-CM | POA: Diagnosis not present

## 2024-02-14 DIAGNOSIS — M8580 Other specified disorders of bone density and structure, unspecified site: Secondary | ICD-10-CM | POA: Diagnosis not present

## 2024-02-14 DIAGNOSIS — N904 Leukoplakia of vulva: Secondary | ICD-10-CM | POA: Diagnosis not present

## 2024-02-14 DIAGNOSIS — Z01419 Encounter for gynecological examination (general) (routine) without abnormal findings: Secondary | ICD-10-CM | POA: Diagnosis not present

## 2024-02-14 DIAGNOSIS — Z780 Asymptomatic menopausal state: Secondary | ICD-10-CM | POA: Diagnosis not present

## 2024-02-14 DIAGNOSIS — Z1212 Encounter for screening for malignant neoplasm of rectum: Secondary | ICD-10-CM | POA: Diagnosis not present

## 2024-03-01 DIAGNOSIS — L814 Other melanin hyperpigmentation: Secondary | ICD-10-CM | POA: Diagnosis not present

## 2024-03-01 DIAGNOSIS — B351 Tinea unguium: Secondary | ICD-10-CM | POA: Diagnosis not present

## 2024-03-01 DIAGNOSIS — Z1283 Encounter for screening for malignant neoplasm of skin: Secondary | ICD-10-CM | POA: Diagnosis not present

## 2024-03-01 DIAGNOSIS — L821 Other seborrheic keratosis: Secondary | ICD-10-CM | POA: Diagnosis not present

## 2024-03-01 DIAGNOSIS — D2339 Other benign neoplasm of skin of other parts of face: Secondary | ICD-10-CM | POA: Diagnosis not present

## 2024-03-01 DIAGNOSIS — D485 Neoplasm of uncertain behavior of skin: Secondary | ICD-10-CM | POA: Diagnosis not present

## 2024-03-21 DIAGNOSIS — M8588 Other specified disorders of bone density and structure, other site: Secondary | ICD-10-CM | POA: Diagnosis not present

## 2024-03-21 DIAGNOSIS — Z780 Asymptomatic menopausal state: Secondary | ICD-10-CM | POA: Diagnosis not present

## 2024-04-25 DIAGNOSIS — Z23 Encounter for immunization: Secondary | ICD-10-CM | POA: Diagnosis not present

## 2024-04-25 DIAGNOSIS — Z9181 History of falling: Secondary | ICD-10-CM | POA: Diagnosis not present

## 2024-04-25 DIAGNOSIS — Z0000 Encounter for general adult medical examination without abnormal findings: Secondary | ICD-10-CM | POA: Diagnosis not present

## 2024-04-25 DIAGNOSIS — R519 Headache, unspecified: Secondary | ICD-10-CM | POA: Diagnosis not present

## 2024-04-25 DIAGNOSIS — G629 Polyneuropathy, unspecified: Secondary | ICD-10-CM | POA: Diagnosis not present

## 2024-05-02 ENCOUNTER — Other Ambulatory Visit: Payer: Self-pay

## 2024-05-02 ENCOUNTER — Encounter (HOSPITAL_COMMUNITY): Payer: Self-pay

## 2024-05-02 ENCOUNTER — Ambulatory Visit (HOSPITAL_COMMUNITY): Attending: Family Medicine

## 2024-05-02 DIAGNOSIS — R29898 Other symptoms and signs involving the musculoskeletal system: Secondary | ICD-10-CM | POA: Diagnosis not present

## 2024-05-02 DIAGNOSIS — Z7409 Other reduced mobility: Secondary | ICD-10-CM | POA: Diagnosis not present

## 2024-05-02 NOTE — Therapy (Signed)
 OUTPATIENT PHYSICAL THERAPY NEURO EVALUATION   Patient Name: Nancy Winters MRN: [id number] DOB:1946/05/23, 78 y.o., female Today's Date: 05/02/2024     END OF SESSION:  PT End of Session - 05/02/24 1622     Visit Number 1    Date for Recertification  06/01/24    Authorization Type MEDICARE PART A AND B    Authorization Time Period no auth    Authorization - Visit Number 1    Progress Note Due on Visit 6    PT Start Time 1546    PT Stop Time 1622    PT Time Calculation (min) 36 min    Activity Tolerance Patient tolerated treatment well    Behavior During Therapy [facility] for tasks assessed/performed          Past Medical History:  Diagnosis Date   Coronary atherosclerosis    Mild, nonobstructive August 2015   Essential hypertension, benign    Female bladder prolapse    GERD (gastroesophageal reflux disease)    Takotsubo syndrome 02/13/2014   Echo 02/13/2014 LVEF 40%   Past Surgical History:  Procedure Laterality Date   BREAST CYST EXCISION Left 1979   BREAST SURGERY Left 2011   Ducts removed   LEFT HEART CATHETERIZATION WITH CORONARY ANGIOGRAM N/A 02/12/2014   Procedure: LEFT HEART CATHETERIZATION WITH CORONARY ANGIOGRAM;  Surgeon: Debby DELENA Sor, MD;  Location: [facility] CATH LAB;  Service: Cardiovascular;  Laterality: N/A;   TONSILLECTOMY     TUBAL LIGATION     Patient Active Problem List   Diagnosis Date Noted   Takotsubo syndrome 02/13/2014   Coronary atherosclerosis of native coronary artery 02/13/2014   NICM (nonischemic cardiomyopathy) EF 35% 02/13/2014   GERD (gastroesophageal reflux disease) 05/14/2013   Precordial pain 05/14/2013   History of TIA (transient ischemic attack) 05/14/2013   PCP: Shona Norleen PEDLAR, MD  REFERRING PROVIDER: Jolee Eudora HERO, FNP ONSET DATE: 2 years  REFERRING DIAG: Z91.81 (ICD-10-CM) - History of falling  THERAPY DIAG:  Impaired functional mobility, balance, gait, and endurance  Weakness of both lower extremities  Rationale for  Evaluation and Treatment: Rehabilitation  SUBJECTIVE:                                                                                                                                                                                             SUBJECTIVE STATEMENT: Stepped into a hole and tripped over some things, has happened 3 times. Pt states she has only fallen once this past year but 2 times last year.  Pt states she lives with son who takes care of her. Pt states she has been  feeling down for the last couple of years, lack of energy. Pt states she does not know what is causing these episodes. Pt states she would like to be able to get up from the floor by herself, states she has gained some weight and unable to at this time.   Pt accompanied by: self  PERTINENT HISTORY:  HTN Heart disease (enlarged heart) SOB, has inhaler as needed, has used it the last two days  PAIN:  Are you having pain? Yes, headaches, last few weeks.  PRECAUTIONS: None  RED FLAGS: None   WEIGHT BEARING RESTRICTIONS: No  FALLS: Has patient fallen in last 6 months? Yes. Number of falls 1  LIVING ENVIRONMENT: Lives with: lives with their son Lives in: House/apartment Stairs: Yes: External: 1 steps; door frame Has following equipment at home: None  PLOF: Independent and Independent with basic ADLs  PATIENT GOALS: be more steady on your feet, be aware of aging, be more realistic with movement, decreased furniture walk, decreased fear of falling, be able to stand back up after fall  OBJECTIVE:  Note: Objective measures were completed at Evaluation unless otherwise noted.  DIAGNOSTIC FINDINGS:  CLINICAL DATA:  Headache and lightheadedness for 2 weeks, no known  injury   EXAM:  CT HEAD WITHOUT CONTRAST   TECHNIQUE:  Contiguous axial images were obtained from the base of the skull  through the vertex without intravenous contrast.   COMPARISON:  None.   FINDINGS:  No mass lesion. No midline shift.  No acute hemorrhage or hematoma.  No extra-axial fluid collections. No evidence of acute infarction.  Calvarium is intact. No significant inflammatory change in the  sinuses.   IMPRESSION:  Negative  COGNITION: Overall cognitive status: Impaired, someday's can't think of words   SENSATION: Light touch: Impaired , pt states she has neuropathy both feet, takes gabapentin   LOWER EXTREMITY ROM:     Active  Right Eval Left Eval  Hip flexion    Hip extension    Hip abduction    Hip adduction    Hip internal rotation    Hip external rotation    Knee flexion    Knee extension    Ankle dorsiflexion    Ankle plantarflexion    Ankle inversion    Ankle eversion     (Blank rows = not tested)  LOWER EXTREMITY MMT:    MMT Right Eval Left Eval  Hip flexion 4- 4-  Hip extension    Hip abduction 4- 4-  Hip adduction 4- 4-  Hip internal rotation    Hip external rotation    Knee flexion 4 4  Knee extension 4- 4-  Ankle dorsiflexion 4 4  Ankle plantarflexion    Ankle inversion    Ankle eversion    (Blank rows = not tested)   STAIRS: Not tested GAIT: Findings: Gait Characteristics: decreased stride length, Distance walked: 440, and Comments: decreased left arm swing and decreased hip extension bilaterally  FUNCTIONAL TESTS:  5 times sit to stand: 12.21 seconds Timed up and go (TUG): 9.61 seconds, no lapses in balance 2 minute walk test: 340 feet, pt states no wheezing, feels good to walk SLS 05/02/24: R: 1.6 seconds L: 1.5 seconds  PATIENT SURVEYS:  ABC scale:  1410 / 1600 = 88.1 %  TREATMENT DATE:  05/02/2024   Evaluation: -Balance and Strength assessed, HEP prescribed, pt educated on prognosis, findings, and importance of HEP compliance if given.         PATIENT EDUCATION: Education details: Pt was educated on findings of PT  evaluation, prognosis, frequency of therapy visits and rationale, attendance policy, and HEP if given.   Person educated: Patient Education method: Explanation, Verbal cues, and Handouts Education comprehension: verbalized understanding, verbal cues required, and needs further education  HOME EXERCISE PROGRAM: Access Code: [email or web address] URL: [email or web address] Date: 05/02/2024 Prepared by: Lang Ada  Exercises - Supine Bridge  - 1 x daily - 7 x weekly - 3 sets - 10 reps - 3 hold - Standing Single Leg Stance with Counter Support  - 1 x daily - 7 x weekly - 1 sets - 3 reps - 30 hold - Sit to Stand with Arms Crossed  - 1 x daily - 7 x weekly - 3 sets - 10 reps  GOALS: Goals reviewed with patient? No  SHORT TERM GOALS: Target date: 05/16/24  Pt will be independent with HEP in order to demonstrate participation in Physical Therapy POC.  Baseline: Goal status: INITIAL  2.  Pt will report no falls for first two weeks of therapy for evidence of increased caution with ADL. Baseline:  Goal status: INITIAL  LONG TERM GOALS: Target date: 05/30/24  Pt will improve 5TSTS score by at least 2.3 seconds in order to demonstrate improved functional strength, safety and balance skills in ADL/mobility.   Baseline: see objective.  Goal status: INITIAL  2.  Pt will improve 2 MWT by at least 40 feet in order to demonstrate improved functional mobility capacity in community setting.  Baseline: see objective.  Goal status: INITIAL  3.  Pt will improve SLS time to 7 seconds bilaterally in order to demonstrate improved balance with advanced functional mobility. Baseline: see objective.  Goal status: INITIAL  4.  Pt will demonstrate the ability to get up from floor with modified independence for decreased fear of falling in order to demonstrate improved capacity, safety, and overall quality of life.  Baseline: see objective.  Goal status: INITIAL   ASSESSMENT:  CLINICAL  IMPRESSION: Patient is a 78 y.o. female who was seen today for physical therapy evaluation and treatment for Z91.81 (ICD-10-CM) - History of falling.   Patient demonstrates decreased LE strength, abnormal gait pattern, and impaired balance. Patient also demonstrates difficulty with ambulation during today's session with decreased stride length and velocity noted. Patient also demonstrates slight SOB with but no wheezing as pt states this will happen occasionally. Patient requires education on role of PT, HEP compliance, and POC. Patient would benefit from skilled physical therapy for increased endurance with ambulation, increased LE strength, and balance for improved gait quality, return to higher level of function with advanced functional transfers, and progress towards therapy goals.   OBJECTIVE IMPAIRMENTS: Abnormal gait, decreased activity tolerance, decreased balance, decreased endurance, decreased mobility, difficulty walking, decreased ROM, and decreased strength.   ACTIVITY LIMITATIONS: carrying, lifting, bending, squatting, transfers, and bed mobility  PARTICIPATION LIMITATIONS: meal prep, cleaning, laundry, community activity, and yard work  PERSONAL FACTORS: Age, Past/current experiences, Time since onset of injury/illness/exacerbation, and 1 comorbidity: hx of falling are also affecting patient's functional outcome.   REHAB POTENTIAL: Good  CLINICAL DECISION MAKING: Stable/uncomplicated  EVALUATION COMPLEXITY: Low  PLAN:  PT FREQUENCY: 1-2x/week  PT DURATION: 4 weeks  PLANNED INTERVENTIONS: 97110-Therapeutic exercises, 97530- Therapeutic activity, [id number]- Neuromuscular re-education, 97535-  Self Care, [id number]- Manual therapy, [phone]- Gait training, Patient/Family education, Balance training, Stair training, Vestibular training, and DME instructions  PLAN FOR NEXT SESSION: FGA, progress endurance and LE strengthening, advanced balance, progress to floor to stand  transfer   Lang Ada, PT, DPT [facility] Office: [phone] 4:28 PM, 05/02/24

## 2024-05-08 ENCOUNTER — Ambulatory Visit (HOSPITAL_COMMUNITY): Admitting: Physical Therapy

## 2024-05-15 ENCOUNTER — Telehealth: Payer: Self-pay | Admitting: Cardiology

## 2024-05-15 MED ORDER — LOSARTAN POTASSIUM 50 MG PO TABS: 50.0000 mg | ORAL_TABLET | Freq: Every day | ORAL | 0 refills | Status: AC

## 2024-05-15 MED ORDER — SPIRONOLACTONE 25 MG PO TABS: 12.5000 mg | ORAL_TABLET | Freq: Every day | ORAL | 0 refills | Status: AC

## 2024-05-15 NOTE — Telephone Encounter (Signed)
*  STAT* If patient is at the pharmacy, call can be transferred to refill team.   1. Which medications need to be refilled? (please list name of each medication and dose if known)   losartan  (COZAAR ) 50 MG tablet  spironolactone  (ALDACTONE ) 25 MG tablet   2. Would you like to learn more about the convenience, safety, & potential cost savings by using the [facility]?   3. Are you open to using the [facility] (Type [facility]. ).  4. Which pharmacy/location (including street and city if local pharmacy) is medication to be sent to?  [facility] - [place], [place] - [address]   5. Do they need a 30 day or 90 day supply?   90 day  Patient stated she still has medication left. Patient has appointment scheduled with Dr. Debera on 2/9.

## 2024-05-15 NOTE — Telephone Encounter (Signed)
 Refill sent.   Appt scheduled for February 2026.

## 2024-05-23 DIAGNOSIS — I10 Essential (primary) hypertension: Secondary | ICD-10-CM | POA: Diagnosis not present

## 2024-05-23 DIAGNOSIS — N83202 Unspecified ovarian cyst, left side: Secondary | ICD-10-CM | POA: Diagnosis not present

## 2024-05-23 DIAGNOSIS — Z881 Allergy status to other antibiotic agents status: Secondary | ICD-10-CM | POA: Diagnosis not present

## 2024-05-23 DIAGNOSIS — K5732 Diverticulitis of large intestine without perforation or abscess without bleeding: Secondary | ICD-10-CM | POA: Diagnosis not present

## 2024-05-23 DIAGNOSIS — Z880 Allergy status to penicillin: Secondary | ICD-10-CM | POA: Diagnosis not present

## 2024-05-23 DIAGNOSIS — K760 Fatty (change of) liver, not elsewhere classified: Secondary | ICD-10-CM | POA: Diagnosis not present

## 2024-05-24 ENCOUNTER — Ambulatory Visit (HOSPITAL_COMMUNITY)

## 2024-05-30 ENCOUNTER — Encounter (HOSPITAL_COMMUNITY): Payer: Self-pay

## 2024-05-30 ENCOUNTER — Ambulatory Visit (HOSPITAL_COMMUNITY): Admitting: Physical Therapy

## 2024-06-05 ENCOUNTER — Ambulatory Visit (HOSPITAL_COMMUNITY)

## 2024-06-05 DIAGNOSIS — R0982 Postnasal drip: Secondary | ICD-10-CM | POA: Diagnosis not present

## 2024-06-05 DIAGNOSIS — K760 Fatty (change of) liver, not elsewhere classified: Secondary | ICD-10-CM | POA: Diagnosis not present

## 2024-06-05 DIAGNOSIS — Z79899 Other long term (current) drug therapy: Secondary | ICD-10-CM | POA: Diagnosis not present

## 2024-06-05 DIAGNOSIS — N83202 Unspecified ovarian cyst, left side: Secondary | ICD-10-CM | POA: Diagnosis not present

## 2024-06-05 DIAGNOSIS — I10 Essential (primary) hypertension: Secondary | ICD-10-CM | POA: Diagnosis not present

## 2024-06-05 DIAGNOSIS — K5792 Diverticulitis of intestine, part unspecified, without perforation or abscess without bleeding: Secondary | ICD-10-CM | POA: Diagnosis not present

## 2024-06-07 ENCOUNTER — Ambulatory Visit (HOSPITAL_COMMUNITY)

## 2024-06-12 ENCOUNTER — Ambulatory Visit (HOSPITAL_COMMUNITY): Admitting: Physical Therapy

## 2024-06-18 ENCOUNTER — Ambulatory Visit (HOSPITAL_COMMUNITY)

## 2024-06-20 ENCOUNTER — Ambulatory Visit (HOSPITAL_COMMUNITY)

## 2024-06-26 ENCOUNTER — Ambulatory Visit (HOSPITAL_COMMUNITY)

## 2024-06-28 ENCOUNTER — Ambulatory Visit (HOSPITAL_COMMUNITY)

## 2024-07-06 ENCOUNTER — Encounter: Payer: Self-pay | Admitting: Gastroenterology

## 2024-07-09 ENCOUNTER — Ambulatory Visit: Admitting: Cardiology

## 2024-07-17 ENCOUNTER — Ambulatory Visit: Admitting: Gastroenterology

## 2024-07-30 ENCOUNTER — Ambulatory Visit: Admitting: Cardiology

## 2024-08-08 ENCOUNTER — Ambulatory Visit: Admitting: Gastroenterology

## 2024-08-14 ENCOUNTER — Ambulatory Visit: Admitting: Cardiology
